# Patient Record
Sex: Female | Born: 1950 | Race: White | Hispanic: No | Marital: Married | Smoking: Never smoker
Health system: Southern US, Community
[De-identification: ages and names within clinical notes are randomized; demographics above are authoritative.]

## PROBLEM LIST (undated history)

## (undated) DIAGNOSIS — K219 Gastro-esophageal reflux disease without esophagitis: Secondary | ICD-10-CM

## (undated) DIAGNOSIS — D649 Anemia, unspecified: Secondary | ICD-10-CM

## (undated) DIAGNOSIS — R002 Palpitations: Secondary | ICD-10-CM

## (undated) DIAGNOSIS — R079 Chest pain, unspecified: Secondary | ICD-10-CM

## (undated) DIAGNOSIS — M810 Age-related osteoporosis without current pathological fracture: Secondary | ICD-10-CM

## (undated) DIAGNOSIS — I10 Essential (primary) hypertension: Secondary | ICD-10-CM

## (undated) DIAGNOSIS — I253 Aneurysm of heart: Secondary | ICD-10-CM

## (undated) DIAGNOSIS — IMO0002 Reserved for concepts with insufficient information to code with codable children: Secondary | ICD-10-CM

## (undated) DIAGNOSIS — R9430 Abnormal result of cardiovascular function study, unspecified: Secondary | ICD-10-CM

## (undated) HISTORY — DX: Reserved for concepts with insufficient information to code with codable children: IMO0002

## (undated) HISTORY — DX: Aneurysm of heart: I25.3

## (undated) HISTORY — DX: Palpitations: R00.2

## (undated) HISTORY — DX: Chest pain, unspecified: R07.9

## (undated) HISTORY — PX: TUBAL LIGATION: SHX77

## (undated) HISTORY — DX: Abnormal result of cardiovascular function study, unspecified: R94.30

## (undated) HISTORY — DX: Anemia, unspecified: D64.9

---

## 1997-12-08 ENCOUNTER — Ambulatory Visit (HOSPITAL_COMMUNITY): Admission: RE | Admit: 1997-12-08 | Discharge: 1997-12-08 | Payer: Self-pay | Admitting: Obstetrics & Gynecology

## 1998-02-11 ENCOUNTER — Other Ambulatory Visit: Admission: RE | Admit: 1998-02-11 | Discharge: 1998-02-11 | Payer: Self-pay | Admitting: Obstetrics & Gynecology

## 1998-03-12 ENCOUNTER — Ambulatory Visit (HOSPITAL_COMMUNITY): Admission: RE | Admit: 1998-03-12 | Discharge: 1998-03-12 | Payer: Self-pay | Admitting: Obstetrics & Gynecology

## 1998-05-09 ENCOUNTER — Ambulatory Visit (HOSPITAL_COMMUNITY): Admission: RE | Admit: 1998-05-09 | Discharge: 1998-05-09 | Payer: Self-pay | Admitting: Obstetrics & Gynecology

## 1999-02-18 ENCOUNTER — Other Ambulatory Visit: Admission: RE | Admit: 1999-02-18 | Discharge: 1999-02-18 | Payer: Self-pay | Admitting: Obstetrics & Gynecology

## 2000-08-25 ENCOUNTER — Other Ambulatory Visit: Admission: RE | Admit: 2000-08-25 | Discharge: 2000-08-25 | Payer: Self-pay | Admitting: Obstetrics & Gynecology

## 2001-02-09 ENCOUNTER — Encounter: Payer: Self-pay | Admitting: Obstetrics & Gynecology

## 2001-02-09 ENCOUNTER — Encounter: Admission: RE | Admit: 2001-02-09 | Discharge: 2001-02-09 | Payer: Self-pay | Admitting: Obstetrics & Gynecology

## 2001-11-04 ENCOUNTER — Other Ambulatory Visit: Admission: RE | Admit: 2001-11-04 | Discharge: 2001-11-04 | Payer: Self-pay | Admitting: Obstetrics & Gynecology

## 2002-12-21 ENCOUNTER — Other Ambulatory Visit: Admission: RE | Admit: 2002-12-21 | Discharge: 2002-12-21 | Payer: Self-pay | Admitting: Obstetrics & Gynecology

## 2004-01-14 ENCOUNTER — Other Ambulatory Visit: Admission: RE | Admit: 2004-01-14 | Discharge: 2004-01-14 | Payer: Self-pay | Admitting: Obstetrics & Gynecology

## 2006-01-05 HISTORY — PX: KNEE RECONSTRUCTION: SHX5883

## 2006-02-21 ENCOUNTER — Inpatient Hospital Stay (HOSPITAL_COMMUNITY): Admission: EM | Admit: 2006-02-21 | Discharge: 2006-02-22 | Payer: Self-pay | Admitting: Emergency Medicine

## 2006-02-21 ENCOUNTER — Ambulatory Visit: Payer: Self-pay | Admitting: Cardiology

## 2006-02-22 ENCOUNTER — Encounter: Payer: Self-pay | Admitting: Cardiology

## 2006-03-02 ENCOUNTER — Ambulatory Visit: Payer: Self-pay | Admitting: Cardiology

## 2006-03-02 LAB — CONVERTED CEMR LAB: TSH: 0.83 microintl units/mL (ref 0.35–5.50)

## 2006-03-09 ENCOUNTER — Emergency Department (HOSPITAL_COMMUNITY): Admission: EM | Admit: 2006-03-09 | Discharge: 2006-03-09 | Payer: Self-pay | Admitting: Emergency Medicine

## 2006-03-10 ENCOUNTER — Ambulatory Visit: Payer: Self-pay | Admitting: Orthopedic Surgery

## 2006-03-12 ENCOUNTER — Ambulatory Visit: Payer: Self-pay | Admitting: Orthopedic Surgery

## 2006-03-12 ENCOUNTER — Ambulatory Visit (HOSPITAL_COMMUNITY): Admission: RE | Admit: 2006-03-12 | Discharge: 2006-03-12 | Payer: Self-pay | Admitting: Orthopedic Surgery

## 2006-03-16 ENCOUNTER — Ambulatory Visit: Payer: Self-pay | Admitting: Orthopedic Surgery

## 2006-03-18 ENCOUNTER — Encounter (HOSPITAL_COMMUNITY): Admission: RE | Admit: 2006-03-18 | Discharge: 2006-04-17 | Payer: Self-pay | Admitting: Orthopedic Surgery

## 2006-03-22 ENCOUNTER — Ambulatory Visit: Payer: Self-pay | Admitting: Orthopedic Surgery

## 2006-04-21 ENCOUNTER — Ambulatory Visit: Payer: Self-pay | Admitting: Orthopedic Surgery

## 2006-05-25 ENCOUNTER — Ambulatory Visit: Payer: Self-pay | Admitting: Orthopedic Surgery

## 2006-07-08 ENCOUNTER — Ambulatory Visit: Payer: Self-pay | Admitting: Orthopedic Surgery

## 2006-10-19 ENCOUNTER — Ambulatory Visit: Payer: Self-pay | Admitting: Orthopedic Surgery

## 2006-10-26 ENCOUNTER — Encounter (INDEPENDENT_AMBULATORY_CARE_PROVIDER_SITE_OTHER): Payer: Self-pay | Admitting: Diagnostic Radiology

## 2006-10-26 ENCOUNTER — Encounter: Admission: RE | Admit: 2006-10-26 | Discharge: 2006-10-26 | Payer: Self-pay | Admitting: Obstetrics & Gynecology

## 2006-12-08 ENCOUNTER — Ambulatory Visit: Payer: Self-pay | Admitting: Orthopedic Surgery

## 2006-12-08 ENCOUNTER — Telehealth: Payer: Self-pay | Admitting: Orthopedic Surgery

## 2006-12-08 DIAGNOSIS — M2240 Chondromalacia patellae, unspecified knee: Secondary | ICD-10-CM | POA: Insufficient documentation

## 2006-12-08 DIAGNOSIS — M76899 Other specified enthesopathies of unspecified lower limb, excluding foot: Secondary | ICD-10-CM | POA: Insufficient documentation

## 2006-12-08 DIAGNOSIS — M25569 Pain in unspecified knee: Secondary | ICD-10-CM | POA: Insufficient documentation

## 2006-12-10 ENCOUNTER — Ambulatory Visit (HOSPITAL_COMMUNITY): Admission: RE | Admit: 2006-12-10 | Discharge: 2006-12-10 | Payer: Self-pay | Admitting: Orthopedic Surgery

## 2006-12-20 ENCOUNTER — Ambulatory Visit: Payer: Self-pay | Admitting: Orthopedic Surgery

## 2007-01-28 ENCOUNTER — Encounter: Payer: Self-pay | Admitting: Orthopedic Surgery

## 2007-02-21 ENCOUNTER — Ambulatory Visit: Payer: Self-pay | Admitting: Orthopedic Surgery

## 2007-02-21 DIAGNOSIS — S82009A Unspecified fracture of unspecified patella, initial encounter for closed fracture: Secondary | ICD-10-CM | POA: Insufficient documentation

## 2007-06-09 ENCOUNTER — Ambulatory Visit: Payer: Self-pay | Admitting: Cardiology

## 2007-06-09 LAB — CONVERTED CEMR LAB
AST: 16 units/L (ref 0–37)
Alkaline Phosphatase: 102 units/L (ref 39–117)
Bilirubin, Direct: 0.1 mg/dL (ref 0.0–0.3)
Direct LDL: 168.5 mg/dL
HDL: 44.2 mg/dL (ref >39.0)
Total Protein: 7.5 g/dL (ref 6.0–8.3)
Triglycerides: 147 mg/dL (ref 0–149)

## 2007-07-14 ENCOUNTER — Ambulatory Visit: Payer: Self-pay | Admitting: Cardiology

## 2007-07-14 ENCOUNTER — Ambulatory Visit (HOSPITAL_COMMUNITY): Admission: RE | Admit: 2007-07-14 | Discharge: 2007-07-14 | Payer: Self-pay | Admitting: Cardiology

## 2007-07-14 LAB — CONVERTED CEMR LAB
BUN: 11 mg/dL (ref 6–23)
Basophils Absolute: 0 10*3/uL (ref 0.0–0.1)
Basophils Relative: 0.7 % (ref 0.0–1.0)
Calcium: 9.5 mg/dL (ref 8.4–10.5)
Creatinine, Ser: 0.7 mg/dL (ref 0.4–1.2)
GFR calc Af Amer: 112 mL/min
GFR calc non Af Amer: 92 mL/min
Hemoglobin: 13.1 g/dL (ref 12.0–15.0)
Lymphocytes Relative: 36.3 % (ref 12.0–46.0)
MCHC: 34.2 g/dL (ref 30.0–36.0)
MCV: 92.3 fL (ref 78.0–100.0)
Neutrophils Relative %: 52.9 % (ref 43.0–77.0)
Prothrombin Time: 11.8 s (ref 10.9–13.3)
RBC: 4.16 M/uL (ref 3.87–5.11)
RDW: 12.2 % (ref 11.5–14.6)
WBC: 5.3 10*3/uL (ref 4.5–10.5)

## 2007-07-19 ENCOUNTER — Inpatient Hospital Stay (HOSPITAL_BASED_OUTPATIENT_CLINIC_OR_DEPARTMENT_OTHER): Admission: RE | Admit: 2007-07-19 | Discharge: 2007-07-19 | Payer: Self-pay | Admitting: Cardiovascular Disease

## 2007-07-19 ENCOUNTER — Ambulatory Visit: Payer: Self-pay | Admitting: Cardiovascular Disease

## 2007-07-30 ENCOUNTER — Emergency Department (HOSPITAL_COMMUNITY): Admission: EM | Admit: 2007-07-30 | Discharge: 2007-07-31 | Payer: Self-pay | Admitting: Emergency Medicine

## 2007-08-01 ENCOUNTER — Ambulatory Visit: Payer: Self-pay | Admitting: Internal Medicine

## 2007-08-01 DIAGNOSIS — E785 Hyperlipidemia, unspecified: Secondary | ICD-10-CM | POA: Insufficient documentation

## 2007-08-01 DIAGNOSIS — R1013 Epigastric pain: Secondary | ICD-10-CM | POA: Insufficient documentation

## 2007-08-01 DIAGNOSIS — M899 Disorder of bone, unspecified: Secondary | ICD-10-CM | POA: Insufficient documentation

## 2007-08-01 DIAGNOSIS — M949 Disorder of cartilage, unspecified: Secondary | ICD-10-CM

## 2007-08-01 DIAGNOSIS — K219 Gastro-esophageal reflux disease without esophagitis: Secondary | ICD-10-CM | POA: Insufficient documentation

## 2007-08-01 DIAGNOSIS — F411 Generalized anxiety disorder: Secondary | ICD-10-CM | POA: Insufficient documentation

## 2007-08-01 LAB — CONVERTED CEMR LAB
AST: 18 units/L (ref 0–37)
Albumin: 4.2 g/dL (ref 3.5–5.2)
Alkaline Phosphatase: 98 units/L (ref 39–117)
Bilirubin Urine: NEGATIVE
Bilirubin, Direct: 0.1 mg/dL (ref 0.0–0.3)
CO2: 29 meq/L (ref 19–32)
Cholesterol: 207 mg/dL (ref 0–200)
Creatinine, Ser: 0.6 mg/dL (ref 0.4–1.2)
Crystals: NEGATIVE
Direct LDL: 139.4 mg/dL
Folate: >20 ng/mL
H Pylori IgG: NEGATIVE
HDL: 41.2 mg/dL (ref >39.0)
Ketones, ur: NEGATIVE mg/dL
MCHC: 33.9 g/dL (ref 30.0–36.0)
MCV: 92.6 fL (ref 78.0–100.0)
Monocytes Absolute: 0.4 10*3/uL (ref 0.1–1.0)
Monocytes Relative: 6.4 % (ref 3.0–12.0)
Neutro Abs: 4 10*3/uL (ref 1.4–7.7)
Neutrophils Relative %: 58.8 % (ref 43.0–77.0)
Potassium: 4.4 meq/L (ref 3.5–5.1)
RBC: 4.11 M/uL (ref 3.87–5.11)
RDW: 12.3 % (ref 11.5–14.6)
Specific Gravity, Urine: >=1.03 (ref 1.000–1.03)
TSH: 0.98 microintl units/mL (ref 0.35–5.50)
Total Bilirubin: 0.8 mg/dL (ref 0.3–1.2)
Total Protein: 7.4 g/dL (ref 6.0–8.3)
Urine Glucose: NEGATIVE mg/dL
Vitamin B-12: 686 pg/mL (ref 211–911)
pH: 5 (ref 5.0–8.0)

## 2007-08-03 ENCOUNTER — Telehealth (INDEPENDENT_AMBULATORY_CARE_PROVIDER_SITE_OTHER): Payer: Self-pay | Admitting: *Deleted

## 2007-08-03 LAB — CONVERTED CEMR LAB: Vit D, 1,25-Dihydroxy: 23 — ABNORMAL LOW (ref 30–89)

## 2007-09-01 ENCOUNTER — Ambulatory Visit: Payer: Self-pay | Admitting: Cardiology

## 2007-09-05 ENCOUNTER — Ambulatory Visit (HOSPITAL_COMMUNITY): Admission: RE | Admit: 2007-09-05 | Discharge: 2007-09-06 | Payer: Self-pay | Admitting: Cardiology

## 2007-09-09 ENCOUNTER — Ambulatory Visit: Payer: Self-pay | Admitting: Cardiology

## 2007-11-11 ENCOUNTER — Encounter: Payer: Self-pay | Admitting: Orthopedic Surgery

## 2008-06-08 ENCOUNTER — Encounter: Payer: Self-pay | Admitting: Cardiology

## 2008-07-13 DIAGNOSIS — E559 Vitamin D deficiency, unspecified: Secondary | ICD-10-CM | POA: Insufficient documentation

## 2008-07-13 DIAGNOSIS — I10 Essential (primary) hypertension: Secondary | ICD-10-CM | POA: Insufficient documentation

## 2008-07-13 DIAGNOSIS — D510 Vitamin B12 deficiency anemia due to intrinsic factor deficiency: Secondary | ICD-10-CM | POA: Insufficient documentation

## 2009-05-13 ENCOUNTER — Encounter: Admission: RE | Admit: 2009-05-13 | Discharge: 2009-05-13 | Payer: Self-pay | Admitting: Otolaryngology

## 2010-05-20 NOTE — Assessment & Plan Note (Signed)
[facility]                            CARDIOLOGY OFFICE NOTE   NAME:[name], Julia CONAWAY                          MRN:          [id number]  DATE:06/09/2007                            DOB:          1950-05-14    PRIMARY CARE PHYSICIAN:  None.   REASON FOR VISIT:  Routine followup.   HISTORY OF PRESENT ILLNESS:  I saw Julia [name] in February of last year.  Her history is detailed in my previous note, including prior discussion  about a diagnostic cardiac catheterization, given intermittent episodes  of chest pain and a family history of cardiovascular disease and a  previously mildly abnormal Myoview demonstrating possible inferior scar  but no frank ischemia.  She was not sure about proceeding at that time  and wanted to consider the matter more and follow up with [place] in a few  weeks.  She did not come in for that followup visit, states that she  fell in March of last year and broke her right kneecap.  I have  actually not seen her back since that time.  She comes in today stating  that overall she has not had any progressive symptoms.  She has  occasional chest pain.  It is not clearly exertion provoked.  She also  has occasional palpitations and is only using her Lopressor very  intermittently, not on a regular basis.  She is also not taking an  aspirin daily.  She went for a health screening done through her local  church in [place], and was noted to have mild carotid  atherosclerosis bilaterally, and moderate risk for osteoporosis based  on bone mineral density testing.  We talked about this some today.  Her  electrocardiogram shows normal sinus rhythm at 84 beats per minute.  In  reviewing the situation again, Julia [name] does not wish to proceed with  any invasive cardiac testing.  She is comfortable with observation and  efforts at risk factor modification.  I spoke with her about specific  medication adjustments and followup lipid testing.  She was  comfortable  with this.   ALLERGIES:  SULFA DRUGS.   PRESENT MEDICATIONS:  1. Monthly B12 injections.  2. Calcium with vitamin D 600 mg p.o. b.i.d.  3. Multivitamin daily.  4. Lopressor 25 mg p.o. b.i.d. p.r.n.   REVIEW OF SYSTEMS:  As described in the history of present illness.  No  syncope, orthopnea, claudication, lower extremity edema.  Otherwise  negative.   PHYSICAL EXAMINATION:  Blood pressure is elevated today, 135/100,  recheck at 140/98.  She has had elevation in her blood pressure like  this dating back for several years intermittently.  Heart rate 84,  weight 163 pounds.  The patient is comfortable and in no acute distress.  HEENT:  Conjunctivae are normal, oropharynx clear.  NECK:  Supple no elevated jugular venous pressure, no carotid bruits.  No thyromegaly.  LUNGS:  Clear without labored breathing.  CARDIAC EXAM:  Reveals a regular rate and rhythm, no loud murmur or S3  gallop.  ABDOMEN:  Soft, nontender, no bruits.  EXTREMITIES:  Exhibit no pitting edema.  Distal pulses 2+.  SKIN:  Warm and dry.  MUSCULOSKELETAL:  No kyphosis is noted.  NEURO/PSYCHIATRIC:  Patient is alert and oriented x3.  Affect seems  appropriate.   IMPRESSION/RECOMMENDATIONS:  1. Prior history of mildly abnormal Myoview without frank ischemia,      but the possibility of inferior Koslosky scar.  We have discussed this      on a number of occasions, and Julia [name], at this point, is most      comfortable with observation and medical therapy without proceeding      with any additional invasive cardiac testing.  I have asked her to      be mindful for any progressive symptom changes, and otherwise she      should start taking an aspirin at 81 mg daily, and resume her      Lopressor at 25 mg p.o. b.i.d., both for efforts at more blood      pressure control and also management of her palpitations.  A lipid      profile and liver function tests will be obtained, as I suspect      statin therapy  may also be indicated, particularly with her mild      atherosclerosis noted on her carotid ultrasound.  She does not have      bruits on examination, and, at this point, I anticipate medical      therapy and observation.  I will plan to see her back over the next      6 months at our [place] office.  2. At this point Julia [name] does not have a primary care physician.  She      states that her husband sees Dr. Jonny Ruiz, and we will try to get her      scheduled to see him as well.     Jonelle Sidle, MD  Electronically Signed    SGM/MedQ  DD: 06/09/2007  DT: 06/09/2007  Job #: [id number]

## 2010-05-20 NOTE — Cardiovascular Report (Signed)
NAME:  Julia [name], Brunilda                      ACCOUNT NO.:  [id number]   MEDICAL RECORD NO.:  [id number]          PATIENT TYPE:  OIB   LOCATION:  1962                         FACILITY:  [facility]   PHYSICIAN:  Veverly Fells. Excell Seltzer, MD  DATE OF BIRTH:  26-Sep-1950   DATE OF PROCEDURE:  07/19/2007  DATE OF DISCHARGE:  07/19/2007                            CARDIAC CATHETERIZATION   PROCEDURE:  Left heart catheterization, selective coronary angiography,  left ventricular angiography.   INDICATION:  Ms. Quakenbush is a 60 year old woman with recurrent chest pain.  She has undergone a Myoview study that was suggestive of inferior scar  without ischemia.  In the setting of her current symptoms, she was  referred for cardiac catheterization.   Risks and indications of the procedure were reviewed with the patient.  Informed consent was obtained.  The right groin was prepped, draped, and  anesthetized with 1% lidocaine.  Using the modified Seldinger technique,  a 4-French sheath was placed in the right femoral artery.  Standard 4-  French Judkins catheters were used for coronary angiography and left  ventriculography.  A pullback across the aortic valve was performed.  All catheter exchanges were performed over a guidewire.  There were no  immediate complications.  The patient tolerated the procedure well.   FINDINGS:  Aortic pressure 132/78 with a mean of 104, left ventricular  pressure 132/15.   Coronary angiography, left mainstem.  There are minimal lumen  irregularities of the left main.  There are no significant stenoses  noted.  The left main bifurcates into the LAD and left circumflex.   LAD.  The LAD is a large-caliber vessel that courses down and reaches  the LV apex.  It supplies a small first diagonal branch and a moderate-  sized second diagonal.  The second diagonal branches into multiple  vessels.  The LAD has no significant stenosis throughout its course.   Left circumflex.  The left circumflex is a  moderate-sized vessel.  There  is a small intermediate branch noted.  The first OM branch is moderate-  sized, the second OM branch is moderate-sized.  There are no significant  stenoses throughout the left circumflex system.  The AV groove  circumflex beyond the second OM is tiny.   Right coronary artery.  The right coronary artery is dominant.  There  are minimal lumen irregularities in the proximal vessel.  There are no  significant stenoses throughout the vessel.  There is a small RV  marginal branch from the midportion.  The vessel bifurcates into a large  PDA branch and a smaller posterolateral branch.   Left ventriculography demonstrates normal LV systolic function.  The  LVEF is estimated between 55 and 60%.  There is no mitral regurgitation.   ASSESSMENT:  1. No significant coronary artery disease.  2. Normal left ventricular function.   PLAN:  Primary risk reduction measures.  The patient's chest pain is  likely noncardiac.      Veverly Fells. Excell Seltzer, MD  Electronically Signed     MDC/MEDQ  D:  07/19/2007  T:  07/19/2007  Job:  [id number]   cc:   Jonelle Sidle, MD

## 2010-05-20 NOTE — Assessment & Plan Note (Signed)
[facility]                       [place] CARDIOLOGY OFFICE NOTE   NAME:Julia [name], Julia [name]                          MRN:          [id number]  DATE:09/01/2007                            DOB:          [birth year]    I last saw Julia [name] back in June of this year in our [place] office.  She had a prior history of a mildly abnormal Myoview without frank  ischemia but the possibility of inferior Gladden scar associated with  fairly atypical recurrent chest pain and palpitations.  She ultimately  elected to proceed with a diagnostic cardiac catheterization after  having extensive discussions about this with me over a period of months.  This study was performed by Dr. Excell Seltzer on July 19, 2007, and revealed no  significant coronary artery disease with overall normal left ventricular  systolic function of 55-60%.  She comes to the office today for routine  followup.  She does not report any problems with chest pain.  Her main  concern is about intermittent palpitations.  This has also been assessed  in the past.  The chart incudes previous cardiac monitoring ordered by  Dr. Myrtis Ser back around the year 2000.  That particular report indicates  that with symptoms of palpitations, the patient was noted to have sinus  rhythm, some sinus tachycardia, and occasional supraventricular ectopy.  She has been managed with beta-blocker therapy, recently switched from  Lopressor to atenolol, and seems to be tolerating this better, although  she remains very concerned about these palpitations.  I have discussed  this with her again in fairly significant detail, trying to reassure  her, and I suspect that she is simply having benign palpitations,  perhaps occasional ectopy.  Recent findings of normal coronary arteries  and normal left ventricular systolic function would argue strongly  against any malignant arrhythmias.  She still would like to investigate  this further, and we talked  about wearing a 48-hour Holter monitor as  her symptoms are reported as being essentially daily.  She has had no  dizziness or syncope.  She does admit to being under stress.  She also  had some concerns about her low vitamin D level and I asked her to  discuss this with her primary care physician.  We talked about her  cholesterol management.  LDL in July was 139 and her HDL was 41.  During  a screening, she was noted to have mild carotid atherosclerosis and I  discussed with her the importance of aspirin and being aggressive in her  LDL control, at least under 100, preferably around 70.   ALLERGIES:  SULFA drugs.   PRESENT MEDICATIONS:  1. B12 injections monthly.  2. Calcium with vitamin D 600 mg p.o. b.i.d.  3. Multivitamin daily.  4. Vitamin D 400 international units daily.  5. Aspirin 81 mg p.o. daily.  6. Omega 3 supplements 1200 mg 2 tablets daily.  7. Atenolol 25 mg daily.  8. Red Yeast Rice 600 mg 2 tablets daily.   REVIEW OF SYSTEMS:  As described in the history of present illness.  Otherwise  negative.   PHYSICAL EXAMINATION:  Blood pressure is 122/80, heart rate is 66 and  regular, and weight is 165 pounds.  The patient is comfortable and in no acute distress.  HEENT:  Conjunctivae is normal.  Oropharynx is clear.  NECK:  Supple.  No elevated jugular venous pressure.  Soft right carotid  bruit.  Thyromegaly is not noted.  There is no thyroid tenderness.  LUNGS:  Clear with unlabored breathing.  CARDIAC:  Regular rate and rhythm.  No pathologic murmur or S3 gallop.  ABDOMEN:  Soft and nontender.  Normoactive bowel sounds.  EXTREMITIES:  No significant pitting edema.  Distal pulses are 2+.  SKIN:  Warm and dry.  MUSCULOSKELETAL:  No kyphosis noted.  NEUROPSYCHIATRIC:  The patient is alert and oriented x3.   IMPRESSION AND RECOMMENDATIONS:  1. Recent documentation of normal coronary arteries and normal      ejection fraction at cardiac catheterization in July.  I  discussed      this with the patient and tried to reassure her.  I would recommend      basic risk factor modification strategies at this point.  I also      talked about a regular exercise regimen with her.  2. Hyperlipidemia.  As discussed above, with findings of mild      atherosclerosis on screening the carotids done at a [facility]      back in March, I think aggressive LDL control would be in her best      interest.  She prefers to avoid statin medications at this point,      although I did not discuss it with her.  She states she will work      on her diet and exercise and we will plan a followup liver profile      and liver function tests in approximately 6 months.  At that time,      I will repeat a carotid duplex.  She will otherwise remain on an      aspirin.  3. Regarding the palpitations, I think that these are most likely      benign.  I have discussed this with her in detail.  She remains      very concerned about them and would like followup monitoring.  We      will arrange a 48-hour Holter monitor.  Otherwise, she will      continue on beta-blocker therapy at this point.     Jonelle Sidle, MD  Electronically Signed    SGM/MedQ  DD: 09/01/2007  DT: 09/02/2007  Job #: [id number]

## 2010-05-20 NOTE — Assessment & Plan Note (Signed)
[facility]                            CARDIOLOGY OFFICE NOTE   NAME:[name], Julia BRICKNER                          MRN:          [id number]  DATE:07/14/2007                            DOB:          28-Apr-1950    I saw Julia [name] in the office for a routine visit back on June 09, 2007.  Please see the dictated note from that visit for full details of the  patient's history and decision making process at that time.  We have  followed her after being seen as an inpatient consult last year with  symptoms of chest pain, left arm, and shoulder discomfort.  She  underwent a Myoview last year, which demonstrated a fixed inferior  defect with hypokinesis in this region and an ejection fraction of 48%,  but no frank ischemia was noted.  We have discussed these findings as  well as her symptoms now on at least two occasions and I have discussed  cardiac catheterization for definitive diagnosis of obstructive coronary  artery disease.  She has consistently preferred not to undergo any  invasive cardiac testing as it was discussed in my most recent note.  At  that time, she was comfortable with observation on medical therapy for  the possibility of underlying cardiovascular disease and was  symptomatically stable overall.   Since that time, Julia [name] has called back into the office and spoken  with nursing.  She stated that she had spoken to her husband about this  issue and ultimately decided that she would like to undergo an elective  outpatient cardiac catheterization.  This was scheduled by Tobi Bastos for  later next week and baseline laboratory data are presently pending.  Further plans to follow in this regard once coronary anatomy is defined.     Jonelle Sidle, MD  Electronically Signed    SGM/MedQ  DD: 07/14/2007  DT: 07/14/2007  Job #: [id number]

## 2010-05-23 NOTE — Assessment & Plan Note (Signed)
[facility]                            CARDIOLOGY OFFICE NOTE   NAME:Falco, AURELIE DICENZO                          MRN:          [id number]  DATE:03/02/2006                            DOB:          13-Nov-1950    REASON FOR VISIT:  Post hospitalization followup.   HISTORY OF PRESENT ILLNESS:  I saw Ms. Birchall as an inpatient consult  recently.  She was admitted to the Hospitalist Service with chest  discomfort, also reporting left arm and shoulder discomfort.  She ruled  out for myocardial infarction and her electrocardiogram was nonspecific.  She had an echocardiogram which revealed an ejection fraction of 50%  with mild focal basal septal hypertrophy but no Bourbeau motion  abnormalities.  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  She was referred for a Myocardial profusion study  which demonstrated a fixed inferior defect with hypokinesis and her  ejection fraction at 48%, but no active ischemia.  This was discussed  with the patient and she was ultimately discharged home with plan for  observation and followup.   She returns today stating that in general she feels better, although she  has had some episodes of chest pain.  She states that in retrospect she  thinks that her left shoulder discomfort was most likely musculoskeletal  given a prior history of tendinitis and given the fact that it has  improved following visits to her chiropractor recently.  She has had  some episodes of pressure in her chest, one of which she noted while  she was under stress in a meeting.  The others seemed more spontaneous  and somewhat different from her reflux.  Her electrocardiogram today was  normal showing sinus rhythm at 92 beats per minute.  She does tell me  that she has taken some of her husband's metoprolol at home and states  that this made her feel much better.  We talked about the situation  again today and she does feel that she would  like to proceed ultimately  with a diagnostic cardiac catheterization to clearly assess her coronary  anatomy.  She is also somewhat concerned about her thyroid status.  She  is not prepared to do any invasive testing now but will plan to see [place]  back in the office over the next few weeks to discuss more definitive  plans.   ALLERGIES:  SULFA DRUGS.   PRESENT MEDICATIONS:  1. Aspirin 81 mg p.o. daily.  2. Multivitamin 1 p.o. daily.  3. Calcium with vitamin D.  4. B-12 injections.   REVIEW OF SYSTEMS:  As described in the HPI.  She has had no dizziness  or syncope.   EXAMINATION:  Blood pressure is 140/90, heart rate 92, weight is 164  pounds, the patient is comfortable and in no acute distress.  HEENT:  Conjunctivae and lids normal, oropharynx is clear.  NECK:  Supple without elevated jugular venous pressure, without bruits.  No thyromegaly is noted.  LUNGS:  Clear without labored breathing at rest.  CARDIAC:  Reveals a regular  rate and rhythm without loud murmur or S3  gallops, there is no pericardial rub.  ABDOMEN:  Soft, nontender.  EXTREMITIES:  Show no pitting edemas.  SKIN:  Warm and dry.  Distal pulses are 2+.  MUSCULOSKELETAL:  No kyphosis is noted.   IMPRESSION/RECOMMENDATION:  1. Episodic chest pain as outlined.  Inpatient Myoview was overall low      risk but mildly abnormal.  We have discussed this and the plan at      this point is to continue aspirin with the addition of Lopressor 25      mg p.o. b.i.d.  I will also check a thyroid stimulating hormone      level.  Ms. Enck will return to the office over the next 3 weeks      and at that point we will likely make firm plans for a diagnostic      cardiac catheterization to clearly evaluate the coronary anatomy.      She will let us know otherwise if symptoms progress in the      meanwhile.  2. Further plans to follow.     Jonelle Sidle, MD  Electronically Signed    SGM/MedQ  DD: 03/02/2006  DT:  03/02/2006  Job #: [id number]

## 2010-05-23 NOTE — Op Note (Signed)
NAME:  Julia [name], Julia [name]                   ACCOUNT NO.:  [id number]   MEDICAL RECORD NO.:  [id number]          PATIENT TYPE:  AMB   LOCATION:  DAY                           FACILITY:  [facility]   PHYSICIAN:  Vickki Hearing, M.D.DATE OF BIRTH:  [birth year]   DATE OF PROCEDURE:  03/12/2006  DATE OF DISCHARGE:                               OPERATIVE REPORT   PREOPERATIVE DIAGNOSES:  Fractured right patella.   POSTOPERATIVE DIAGNOSES:  Fractured right patella.   PROCEDURE:  Removal of distal patellar fragment, advancement of patellar  tendon.   HISTORY:  This is a 60 year old female injured on 03/09/2006, fell onto  right knee at [facility], fractured her patella.  She was  scheduled for surgery after an informed consent process was completed.   SURGEON:  Vickki Hearing, M.D.   ASSISTANT:  Trenton Founds.   ANESTHESIA:  Spinal.   SPECIMENS:  None.   BLOOD LOSS:  50 mL   COMPLICATIONS:  None.   COUNTS:  Correct.   DISPOSITION:  The patient went to recovery room in stable condition.   DETAILS OF PROCEDURE:  The patient was identified as [name].  Limb  site was identified as right knee by patient and surgeon, marked as  such.  History and physical updated.  The patient taken to the operating  room, antibiotic started, spinal given.  The patient placed supine on  operating table, right knee prepped and draped in sterile technique.   Time-out procedure completed.  A 6-inch Esmarch was used to exsanguinate  the limb.  Tourniquet was elevated to 300 millimeter mercury.  Straight  incision was made centered over the patella, extended proximally and  distally down to the extensor mechanism.  Subcutaneous tissue was  cauterized to control bleeding.  Fracture was inspected, was debrided.  The knee joint was inspected and debrided.  Cartilage looked fairly  normal.   We placed a clamp around the distal fragment to attach it to the  proximal fragment but it was too  comminuted to repair so it was removed.  Patellar tendon was then advanced to the patella.   We used to #5 Tycron sutures weaved through the patellar tendon and then  passed through drill holes in the patella.  We tied them proximally.  We  checked range of motion.  She easily got 90 degrees of flexion without  any tension on the suture line.  We repaired the retinaculum with 0  Monocryl.  We injected the knee joint with 20 mL of Marcaine with  epinephrine.  We closed with 0 and 2-0 Monocryl and staples.  We applied  sterile dressing, cryo cuff and knee brace locked in extension.   The patient was taken to the recovery room in stable condition.   POSTOP PLAN:  She can weight bear with a walker or crutches and  extension with the brace locked.  We will advance her range of motion 0  to 45 in a couple of days.  She will follow-up with me in the office.  She will be discharged home on Lortab, ibuprofen  and Phenergan.      Vickki Hearing, M.D.  Electronically Signed     SEH/MEDQ  D:  03/12/2006  T:  03/12/2006  Job:  [id number]

## 2010-05-23 NOTE — Discharge Summary (Signed)
NAME:  Julia [name], Julia [name]                   ACCOUNT NO.:  [id number]   MEDICAL RECORD NO.:  [id number]          PATIENT TYPE:  INP   LOCATION:  3707                         FACILITY:  [facility]   PHYSICIAN:  Hillery Aldo, M.D.   DATE OF BIRTH:  09-01-1950   DATE OF ADMISSION:  02/21/2006  DATE OF DISCHARGE:  02/22/2006                               DISCHARGE SUMMARY   PRIMARY CARE PHYSICIAN:  The patient is unassigned.   DISCHARGE DIAGNOSES:  1. Noncardiac chest pain.  2. Gastroesophageal reflux disease.  3. History of tendinitis.  4. History of pernicious anemia.  5. Cholelithiasis.  6. Atrial septal aneurysm.   DISCHARGE MEDICATIONS:  Zantac OTC 1 tablet b.i.d. p.r.n.  Note, the  patient was also encouraged to try fish oil supplements for lipid  control.   CONSULTATIONS:  Dr. Diona Browner of cardiology.   BRIEF ADMISSION HISTORY OF PRESENT ILLNESS:  The patient is a 60-year-  old female who presented to the emergency department with 4- to 5-hour  history of chest pain radiating to the neck, left arm and back.  The  pain was intermittent and described as a dull ache.  For the full  details of the HPI, please see the dictated report done by Dr. Hadley Pen.   PROCEDURES AND DIAGNOSTIC STUDIES:  1. Chest x-ray on February 21, 2006 showed no active cardiopulmonary      disease.  2. Two-D echocardiogram done on February 22, 2006 showed systolic      function at the lower limits of normal with an ejection fraction of      50%.  There were no left ventricular regional Smalls motion      abnormalities.  There was mild focal basal septal hypertrophy.      There was an atrial septal aneurysm.  3. Myoview on February 22, 2006 showed a fixed defect with hypokinesis      and ejection fraction of 48% but no ischemia.  Recommendations were      for outpatient follow-up.   HOSPITAL COURSE:  1. Chest pain:  The patient was admitted to the telemetry floor and      monitored closely.  Serial enzymes were  negative x3 sets.  There      were no abnormalities noted on 12-lead EKG.  A cardiology      consultation was requested and kindly provided by Dr. Diona Browner who      performed his Myoview on February 22, 2006.  It was felt that in      the setting of negative enzymes and a normal EKG no further      diagnostic workup was indicated.  The patient did not have any      further chest pains during the remainder of her hospitalization.      She is therefore stable for discharge and will follow up with Dr.      Diona Browner p.r.n.  2. Gastroesophageal reflux disease:  The patient was given a GI      cocktail which seemed to alleviate her chest discomfort.      Additionally, she  was put on proton pump inhibitor therapy.  She      can continue to take Zantac over-the-counter p.r.n., and if this      does not adequately control her reflux, she should follow up with a      primary care physician for a prescription strength proton pump      inhibitor therapy.  3. Dyslipidemia:  The patient did have a fasting lipid panel checked      and was found to have a total cholesterol 202, triglycerides 195,      HDL of 45, and LDL of 118.  The patient was encouraged to try a low-      fat/cholesterol diet for 6 months and to supplement her diet with      fish oil capsules.  She is also encouraged to follow up with a      primary care doctor to follow up and monitor her dyslipidemia.   DISPOSITION:  The patient is stable for discharge home.  Again, she is  encouraged to find a primary care physician.      Hillery Aldo, M.D.  Electronically Signed     CR/MEDQ  D:  02/22/2006  T:  02/23/2006  Job:  [id number]

## 2010-05-23 NOTE — H&P (Signed)
NAME:  Julia [name], Julia [name]                   ACCOUNT NO.:  [id number]   MEDICAL RECORD NO.:  [id number]          PATIENT TYPE:  EMS   LOCATION:  MAJO                         FACILITY:  [facility]   PHYSICIAN:  Theresia Bough, MD       DATE OF BIRTH:  Jan 29, 1950   DATE OF ADMISSION:  02/21/2006  DATE OF DISCHARGE:                              HISTORY & PHYSICAL   PRESENTING COMPLAINT:  Chest pain.   HISTORY OF PRESENT ILLNESS:  This is a 60 year old white female patient  who came in because of left-sided chest pain.  Pain started about 5 days  ago and has been intermittent.  Patient's chest pain was relieved at the  ED by 0.4 mg of sublingual nitroglycerin.  She denies diaphoresis.  No  nausea.  No vomiting.  No diarrhea.  No radiation of chest pain.  She  denies any abdominal pain.  She denies any headaches.  No dizziness and  no blurring of vision.  She has no feet swelling.  She denies any fever.  No cough.  No wheezing and no dysuria.  She denied any significant past  medical history.  She has been in good health.   FAMILY HISTORY:  Her father died of massive MI at the age of 51.  Her  mother also has a history of MI around the age of 40.   SOCIAL HISTORY:  She lives with her husband.  She does not smoke  cigarettes.  She does not use alcohol.  Denies no history of drug abuse.   DRUG ALLERGIES:  TO SULFA.   HOME MEDICATIONS:  Include Zantac.   PHYSICAL EXAMINATION:  VITAL SIGNS:  Shows temperature of 98.3.  Pulse  rate of 86.  Respirations of 16.  Blood pressure of 137/89.  HEAD AND NECK:  Shows pink conjunctivae.  She has no jaundice.  Neck is  supple.  Mucous membranes is moist.  CHEST:  Showed normal respiration.  Auscultation shows normal breath  sounds.  CARDIOVASCULAR:  Showed normal heart sounds.  No murmur.  No gallop.  ABDOMEN:  Soft, nontender.  No masses palpable.  Patient has normal  bowel sounds.  EXTREMITIES:  Shows no edema.  SKIN:  Warm to touch.  CENTRAL NERVOUS SYSTEM:   Patient is alert and oriented x3.  Power is 4/4  in all limbs.  There is no focal deficits.   Initial blood work shows a sodium of 141, potassium of 4.2, chloride of  108, bicarb of 27, BUN of 11, creatinine 0.7, glucose of 99.  EKG is  normal sinus rhythm.  No acute ST/T changes.  Troponin is 0.0.  CK-MB is  less than 1.0.  A chest x-ray is negative.   ASSESSMENT:  New onset angina.   My plan is to admit patient to telemetry bed.  I will continue serial CK-  MB and troponin.  I will also do a fasting lipid profile.  Patient is  going to have metoprolol 25 mg p.o. q.12 hours, hold for blood pressure  less than or equal to 90/60.  I have  called cardiology consult for  stress test.      Theresia Bough, MD  Electronically Signed     GA/MEDQ  D:  02/21/2006  T:  02/22/2006  Job:  [id number]

## 2010-05-23 NOTE — Consult Note (Signed)
Julia [name], Julia [name]                   ACCOUNT NO.:  [id number]   MEDICAL RECORD NO.:  [id number]          PATIENT TYPE:  INP   LOCATION:  3707                         FACILITY:  [facility]   PHYSICIAN:  Jonelle Sidle, MD DATE OF BIRTH:  January 05, 1951   DATE OF CONSULTATION:  02/21/2006  DATE OF DISCHARGE:  02/22/2006                                 CONSULTATION   REFERRING PHYSICIAN:  [facility].   PRIMARY CARDIOLOGIST:  Luis Abed, MD, [facility].   PRIMARY CARE PHYSICIAN:  Laverle Hobby, M.D.   HISTORY OF PRESENT ILLNESS:  This is a very pleasant 60 year old  Caucasian female with no prior cardiac history with complaints of a 4-5  day history of chest discomfort radiating to the neck, left arm and  back.  The patient states the pain goes and comes at rest, described as  a dull ache with no associated diaphoresis, dizziness or shortness of  breath.  The patient has a history of GERD and she felt like this was  probably related to that, so she took some Zantac over the last three  days with no relief.  She also has a history of left arm tendinitis  which she took some pain medication for which did not relieve the chest  discomfort.  Secondary to these continuing symptoms the patient  presented to the emergency room on 02/21/2006 and was admitted to rule  out cardiac etiology.   PAST MEDICAL HISTORY:  Includes GERD, tendinitis of the left arm,  pernicious anemia and history of gallstones.   PAST SURGICAL HISTORY:  Bilateral tubal ligation and cosmetic surgery.   SOCIAL HISTORY:  The patient lives in [place] with her husband.  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  She has no  children.  She does not smoke.  She occasionally drinks alcohol.  No  drug use.  No herbal medicine use.  She is not on a special diet and she  does not exercise regularly.   FAMILY HISTORY:  Mother died at age 44 with a myocardial infarction.  Father died at age 26 with a  myocardial infarction.  She has two  brothers who are generally in good health with no cardiac history.   CURRENT MEDICATIONS:  At home calcium, vitamin D supplements,  multivitamins.  She takes a B12 shot once a month.  While hospitalized:  1. Aspirin 81 mg once a day.  2. Lovenox once a day,  3. Lopressor 25 mg b.i.d..  4. Protonix 40 mg once a day.   ALLERGIES:  SULFA.   PAST CARDIAC WORK UP:  The patient states she had a Cardiolite stress  test approximately 10 years ago with Dr. Myrtis Ser secondary to palpitations.  It was found to be negative and palpitations were found to be related to  birth control pill use.  When she stopped taking birth control pills  these palpitations went away.   CURRENT LABS:  Troponins are negative times two at 0.02 and 0.02.  Sodium 141, potassium 4.2, chloride 108, CO2 24, BUN 11, creatinine 0.7,  glucose 99,  cholesterol 202 and lipids 195, HDL 45, LDL 118.   PHYSICAL EXAMINATION:  VITAL SIGNS:  Blood pressure 126/84, pulse 80,  respirations 20, temperature 97.3.  The patient weighs 73.5 kg.  She is  on room air at 100% 02 sat.  HEENT:  Head is normocephalic and atraumatic.  Eyes:  PERRLA.  Mucous  membranes mouth pink and moist.  Tongue is midline.  NECK:  Supple.  There is no thyromegaly noted.  No carotid bruits  appreciated.  CARDIOVASCULAR:  Regular rate and rhythm.  Slightly tachycardiac without  murmurs, rubs or gallops.  LUNGS:  Clear to auscultation.  ABDOMEN:  Soft and nontender.  No Murphy's sign is elicited with  palpitation.  There  are 2+ bowel sounds.  EXTREMITIES:  Without clubbing, cyanosis or edema.  Dorsalis pedis  pulses and radial pulses are 1+ bilaterally.  SKIN:  Warm and dry.  NEURO:  Intact.   IMPRESSION:  1. Chest pain, rule out cardiac etiology.  2. Known history of pernicious anemia.  3. History of gastroesophageal reflux disease.   PLAN:  The patient has been seen and examined by myself and Dr. Nona Dell.   The patient has a family history of CAD and mildly elevated  LDL with no prior documented coronary artery disease.  Her reported  tendinitis of the left shoulder and arm as well as reflux along with  some left-sided chest pain radiating in to the arm and neck is noted.  Fairly prolonged and mild to moderate pain.  She did have a stress test  in the past with Dr. Myrtis Ser.  Troponin and EKG are normal.  We discussed  with her options of cardiac catheterization versus Myoview stress test  for further cardiac evaluation.  She has elected to proceed with Myoview  stress test at this point.  If the stress test is low risk, the patient  will be seen back as an outpatient with Dr. Myrtis Ser for further evaluation  and treatment and discussion of necessity to place her on a statin.  At  this time we will continue her current medication regimen and will  schedule a Myoview stress test today with results to be discussed later.      Bettey Mare. Lyman Bishop, NP      Jonelle Sidle, MD  Electronically Signed    KML/MEDQ  D:  02/22/2006  T:  02/23/2006  Job:  [id number]

## 2011-09-29 ENCOUNTER — Encounter (HOSPITAL_COMMUNITY): Payer: Self-pay | Admitting: *Deleted

## 2011-09-29 DIAGNOSIS — I10 Essential (primary) hypertension: Secondary | ICD-10-CM | POA: Insufficient documentation

## 2011-09-29 DIAGNOSIS — K219 Gastro-esophageal reflux disease without esophagitis: Secondary | ICD-10-CM | POA: Insufficient documentation

## 2011-09-29 DIAGNOSIS — R079 Chest pain, unspecified: Principal | ICD-10-CM | POA: Insufficient documentation

## 2011-09-29 NOTE — ED Notes (Signed)
The pt is c/o lt upper chest pain with nausea since she woke up on the couch approx 1-2 hours ago.  She took 2 81 mg aspirin  Before she left home and the pain is better now.  History of reflux

## 2011-09-30 ENCOUNTER — Emergency Department (HOSPITAL_COMMUNITY)
Admit: 2011-09-30 | Discharge: 2011-09-30 | Disposition: A | Discharge status: Home / Self Care | Payer: BC Managed Care – PPO | Source: Ambulatory Visit | Attending: Emergency Medicine | Admitting: Emergency Medicine

## 2011-09-30 ENCOUNTER — Observation Stay (HOSPITAL_COMMUNITY)
Admission: EM | Admit: 2011-09-30 | Discharge: 2011-09-30 | Disposition: A | Discharge status: Home / Self Care | Payer: BC Managed Care – PPO | Attending: Emergency Medicine | Admitting: Emergency Medicine

## 2011-09-30 DIAGNOSIS — R079 Chest pain, unspecified: Secondary | ICD-10-CM

## 2011-09-30 HISTORY — DX: Essential (primary) hypertension: I10

## 2011-09-30 HISTORY — DX: Gastro-esophageal reflux disease without esophagitis: K21.9

## 2011-09-30 LAB — POCT I-STAT TROPONIN I: Troponin i, poc: 0 ng/mL (ref 0.00–0.08)

## 2011-09-30 LAB — CBC WITH DIFFERENTIAL/PLATELET
Eosinophils Relative: 2 % (ref 0–5)
HCT: 37.6 % (ref 36.0–46.0)
Lymphocytes Relative: 46 % (ref 12–46)
MCH: 31.5 pg (ref 26.0–34.0)
MCHC: 34 g/dL (ref 30.0–36.0)
MCV: 92.6 fL (ref 78.0–100.0)
Neutro Abs: 3.8 10*3/uL (ref 1.7–7.7)
Neutrophils Relative %: 44 % (ref 43–77)
Platelets: 267 10*3/uL (ref 150–400)
RBC: 4.06 MIL/uL (ref 3.87–5.11)
RDW: 12.5 % (ref 11.5–15.5)

## 2011-09-30 LAB — COMPREHENSIVE METABOLIC PANEL
Albumin: 3.7 g/dL (ref 3.5–5.2)
Chloride: 105 mEq/L (ref 96–112)
Creatinine, Ser: 0.69 mg/dL (ref 0.50–1.10)
GFR calc non Af Amer: >90 mL/min (ref >90)
Glucose, Bld: 101 mg/dL — ABNORMAL HIGH (ref 70–99)
Sodium: 141 mEq/L (ref 135–145)
Total Bilirubin: 0.3 mg/dL (ref 0.3–1.2)

## 2011-09-30 MED ORDER — METOPROLOL TARTRATE 25 MG PO TABS
100.0000 mg | ORAL_TABLET | Freq: Once | ORAL | Status: AC
  Administered 2011-09-30: 100 mg via ORAL
  Filled 2011-09-30: qty 4
  Filled 2011-09-30: qty 1
  Filled 2011-09-30: qty 3

## 2011-09-30 MED ORDER — METOPROLOL TARTRATE 25 MG PO TABS
50.0000 mg | ORAL_TABLET | Freq: Once | ORAL | Status: AC
  Administered 2011-09-30: 50 mg via ORAL
  Filled 2011-09-30: qty 2

## 2011-09-30 NOTE — ED Notes (Signed)
Oriented pt back to room from the bathroom

## 2011-09-30 NOTE — ED Notes (Signed)
EKG completed and given to EDP along with previous EKG

## 2011-09-30 NOTE — ED Notes (Signed)
Spoke with Britta Mccreedy in [place] - advised MD was not available for CT at this time.   Heartrate still over 60, Britta Mccreedy will callback to advised.

## 2011-09-30 NOTE — ED Provider Notes (Signed)
10:37 AM BP 137/82  Pulse 78  Temp 98.5 F (36.9 C) (Oral)  Resp 15  Ht 5\' 9"  (1.753 m)  Wt 164 lb 14.4 oz (74.798 kg)  BMI 24.35 kg/m2  SpO2 99%   Filed Vitals:   09/30/11 0700 09/30/11 0800 09/30/11 0830 09/30/11 0900  BP: 125/69 142/87 137/89 137/82  Pulse: 78 83 74 78  Temp:      TempSrc:      Resp: 13 14 14 15   Height: 5\' 9"  (1.753 m)     Weight: 164 lb 14.4 oz (74.798 kg)     SpO2: 98% 99% 97% 99%  Patient here a chest pain protocol. Patient has received 150 mg of metoprolol and her heart rate, has not dropped below 70. At this point. She is no longer a candidate for CT angiogram as we cannot decrease her heart rate, low enough. Patient states that she is not having any chest pain. Currently. She does still have some back pain, which she states is on the spinous process at about T5. She is tender to palpation of the spinous process. She's also tender to palpation of the anterior chest Nielson on the left side. States is similar to her previous chest pain that she came in to be evaluated for. She has had negative EKG. Repeat troponin done this morning at 7:56 AM was also negative.  CV: RRR, No M/R/G, Peripheral pulses intact. No peripheral edema. Lungs: CTAB Abd: Soft, Non tender, non distended     I discussed the patient with Dr. Azalia Bilis, who is the attending physician for this patient. At this point. I believe she is safe for discharge. I will have her followup with cardiology outpatient. Dr. Patria Mane agrees with plan of care. Patient expresses understanding and agrees with plan. Discussed return precautions  Arthor Captain, PA-C 09/30/11 1043

## 2011-09-30 NOTE — ED Provider Notes (Signed)
History     CSN: [id number]  Arrival date & time 09/29/11  2346   First MD Initiated Contact with Patient 09/30/11 0110      Chief Complaint  Patient presents with  . Chest Pain    (Consider location/radiation/quality/duration/timing/severity/associated sxs/prior treatment) Patient is a 61 y.o. female presenting with chest pain. The history is provided by the patient.  Chest Pain The chest pain began 3 - 5 hours ago. Duration of episode(s) is 2 minutes. Chest pain occurs intermittently. The chest pain is resolved. At its most intense, the pain is at 7/10. The pain is currently at 0/10. The severity of the pain is moderate. The quality of the pain is described as heavy. The pain does not radiate. She tried aspirin for the symptoms.  Her past medical history is significant for hypertension.  Her family medical history is significant for CAD in family.  Procedure history is positive for cardiac catheterization. Procedure history comments: 2009.     Past Medical History  Diagnosis Date  . GERD (gastroesophageal reflux disease)   . Hypertension     History reviewed. No pertinent past surgical history.  No family history on file.  History  Substance Use Topics  . Smoking status: Never Smoker   . Smokeless tobacco: Not on file  . Alcohol Use: No    OB History    Grav Para Term Preterm Abortions TAB SAB Ect Mult Living                  Review of Systems  Cardiovascular: Positive for chest pain.  All other systems reviewed and are negative.    Allergies  Sulfur  Home Medications  No current outpatient prescriptions on file.  BP 148/96  Pulse 86  Temp 98.5 F (36.9 C) (Oral)  Resp 20  SpO2 97%  Physical Exam  Constitutional: She is oriented to person, place, and time. She appears well-developed and well-nourished.  HENT:  Head: Normocephalic and atraumatic.  Eyes: Conjunctivae normal and EOM are normal. Pupils are equal, round, and reactive to light.    Neck: Normal range of motion.  Cardiovascular: Normal rate, regular rhythm and normal heart sounds.   Pulmonary/Chest: Effort normal and breath sounds normal.  Abdominal: Soft. Bowel sounds are normal.  Musculoskeletal: Normal range of motion.  Neurological: She is alert and oriented to person, place, and time.  Skin: Skin is warm and dry.  Psychiatric: She has a normal mood and affect. Her behavior is normal.    ED Course  Procedures (including critical care time)  Labs Reviewed  COMPREHENSIVE METABOLIC PANEL - Abnormal; Notable for the following:    Glucose, Bld 101 (*)     All other components within normal limits  CBC WITH DIFFERENTIAL  TROPONIN I   Dg Chest 2 View  09/30/2011  *RADIOLOGY REPORT*  Clinical Data: Left-sided chest pain.  CHEST - 2 VIEW  Comparison: 07/14/2007.  Findings:  Cardiopericardial silhouette within normal limits. Mediastinal contours normal. Trachea midline.  No airspace disease or effusion.  IMPRESSION: No active cardiopulmonary disease.   Original Report Authenticated By: Andreas Newport, M.D.      No diagnosis found.   Date: 09/30/2011  Rate: 86  Rhythm: normal sinus rhythm  QRS Axis: normal  Intervals: normal  ST/T Wave abnormalities: normal  Conduction Disutrbances: none  Narrative Interpretation: unremarkable     MDM  + atypical chest pain.  Enzymes neg.  Will place in cdu on chest pain protocol,  reassess  Zoe Goonan Lytle Michaels, MD 09/30/11 0140

## 2011-09-30 NOTE — ED Notes (Signed)
BMI 24.4 

## 2011-09-30 NOTE — ED Notes (Signed)
I Stat Troponin results  cTnl  0.00 ng/mL

## 2011-09-30 NOTE — ED Notes (Signed)
Spoke with [employer], MD and he advised need to give an additional 50 mg of Metoprolol and wait an hour to see if HR will come down to under 70.   Advised PA.

## 2011-09-30 NOTE — ED Notes (Signed)
TROPONIN RESULTS  cTnl  0.00 ng/mL

## 2011-09-30 NOTE — ED Notes (Signed)
Order move pt to CDU for further study pt remains pain free at this time.

## 2011-09-30 NOTE — ED Notes (Signed)
Results of troponin per POCT protocols:    0.00

## 2011-09-30 NOTE — Progress Notes (Signed)
Utilization review completed.  

## 2011-09-30 NOTE — ED Notes (Signed)
Ambulated pt to the restroom  

## 2011-09-30 NOTE — ED Provider Notes (Signed)
Medical screening examination/treatment/procedure(s) were performed by non-physician practitioner and as supervising physician I was immediately available for consultation/collaboration.   Lyanne Co, MD 09/30/11 [phone]

## 2011-09-30 NOTE — ED Notes (Signed)
Patient resting quietly, calm with unlabored respirations.  

## 2011-10-01 ENCOUNTER — Encounter: Payer: Self-pay | Admitting: Cardiology

## 2011-10-01 DIAGNOSIS — K219 Gastro-esophageal reflux disease without esophagitis: Secondary | ICD-10-CM | POA: Insufficient documentation

## 2011-10-01 DIAGNOSIS — I253 Aneurysm of heart: Secondary | ICD-10-CM | POA: Insufficient documentation

## 2011-10-01 DIAGNOSIS — R079 Chest pain, unspecified: Secondary | ICD-10-CM | POA: Insufficient documentation

## 2011-10-01 DIAGNOSIS — R9430 Abnormal result of cardiovascular function study, unspecified: Secondary | ICD-10-CM | POA: Insufficient documentation

## 2011-10-02 ENCOUNTER — Ambulatory Visit (INDEPENDENT_AMBULATORY_CARE_PROVIDER_SITE_OTHER): Payer: BC Managed Care – PPO | Admitting: Cardiology

## 2011-10-02 ENCOUNTER — Encounter: Payer: Self-pay | Admitting: Cardiology

## 2011-10-02 VITALS — BP 154/90 | HR 88 | Ht 69.0 in | Wt 165.4 lb

## 2011-10-02 DIAGNOSIS — R079 Chest pain, unspecified: Secondary | ICD-10-CM

## 2011-10-02 DIAGNOSIS — I10 Essential (primary) hypertension: Secondary | ICD-10-CM

## 2011-10-02 DIAGNOSIS — R002 Palpitations: Secondary | ICD-10-CM

## 2011-10-02 DIAGNOSIS — K219 Gastro-esophageal reflux disease without esophagitis: Secondary | ICD-10-CM

## 2011-10-02 NOTE — Assessment & Plan Note (Signed)
Blood pressure is slightly elevated. She appears to have white coat hypertension. Her blood pressure when checked elsewhere is always normal. No further workup.

## 2011-10-02 NOTE — Progress Notes (Signed)
HPI   Patient is seen for cardiology followup. She is seen after a emergency room visit. The patient had been seen in the past by our group. She's not been seen for many years. 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 As part of today's evaluation I have reviewed all the old records and completely updated the electronic medical record. Patient has a family history of coronary disease. She had a borderline abnormal nuclear stress test in 2009 and eventually cardiac catheterization was done April, 2009. There was no significant coronary disease. Also historically she's had some palpitations. She actually wore a monitor in 2000. She has sinus rhythm with sinus tachycardia. There was some supraventricular ectopy. She was treated with a beta blocker and felt better.  Recently on one evening she had been to the funeral home after a 36 year old friend had died. She was at home and had eaten some and lay  flat on the couch. She does have GERD. She awoke feeling some chest discomfort. Eventually she was seen in the emergency room. There was no EKG change. Enzymes were normal. She was allowed to go home and she is now here for followup.  Her pain resolved spontaneously in the emergency room. She has not had any problems since. She's not having any significant palpitations.  Allergies  Allergen Reactions  . Sulfur Hives    Current Outpatient Prescriptions  Medication Sig Dispense Refill  . aspirin EC 81 MG tablet Take 81 mg by mouth daily.      Marland Kitchen atenolol (TENORMIN) 25 MG tablet Take 12.5 mg by mouth 2 (two) times daily.      . cetirizine (ZYRTEC) 10 MG tablet Take 10 mg by mouth daily.      . Cholecalciferol (VITAMIN D-3) 5000 UNITS TABS Take 1 tablet by mouth daily.      . Coenzyme Q10 (CO Q 10 PO) Take 1 tablet by mouth daily.      . CYANOCOBALAMIN IJ Inject 100 mg as directed once a week. Usually on Wednesday      . estradiol (ESTRACE) 0.5 MG tablet Take 0.5 mg by mouth daily.      .  medroxyPROGESTERone (PROVERA) 2.5 MG tablet Take 2.5 mg by mouth daily.      . Multiple Vitamin (MULTIVITAMIN WITH MINERALS) TABS Take 1 tablet by mouth daily.      . Omega-3 Fatty Acids (FISH OIL PO) Take 2 capsules by mouth 2 (two) times daily.      Marland Kitchen omeprazole (PRILOSEC) 20 MG capsule Take 20 mg by mouth daily.      . Red Yeast Rice Extract (RED YEAST RICE PO) Take 1 tablet by mouth 2 (two) times daily.      Marland Kitchen thyroid (ARMOUR) 30 MG tablet Take 30 mg by mouth daily.      Marland Kitchen trimethoprim (TRIMPEX) 100 MG tablet Take 100 mg by mouth 2 (two) times daily.         History   Social History  . Marital Status: Married    Spouse Name: N/A    Number of Children: N/A  . Years of Education: N/A   Occupational History  . Not on file.   Social History Main Topics  . Smoking status: Never Smoker   . Smokeless tobacco: Not on file  . Alcohol Use: No  . Drug Use:   . Sexually Active:    Other Topics Concern  . Not on file   Social History Narrative  . No narrative  on file    No family history on file.  Past Medical History  Diagnosis Date  . GERD (gastroesophageal reflux disease)   . Hypertension   . Chest pain     Myoview in the past, no definite ischemia,, possible inferior scar   //   cardiac catheterization July 19, 2007,  no significant coronary disease,  EF 55-60%  . Ejection fraction     EF 50-55%, echo, February, 2008,  no Antenucci motion abnormalities     EF 55-60%, catheterization, 2009  . Palpitations     Monitor in 2000 showed sinus rhythm and sinus tachycardia. There were occasional supraventricular beats. She was treated with beta blocker  . Atrial septal aneurysm     Incidental finding, echo, 2008    No past surgical history on file.  ROS   Patient denies fever, chills, headache, sweats, rash, change in vision, change in hearing, chest pain, cough, nausea vomiting, urinary symptoms. All other systems are reviewed and are negative.  PHYSICAL EXAM   Patient is  oriented to person time and place. Affect is normal. There is no jugular venous distention. Lungs are clear. Respiratory effort is nonlabored. Cardiac exam reveals S1 and S2. There no clicks or significant murmurs. Abdomen is soft. There is no peripheral edema. There no musculoskeletal deformities. There are no skin rashes.  Filed Vitals:   10/02/11 1051  BP: 154/90  Pulse: 88  Height: 5\' 9"  (1.753 m)  Weight: 165 lb 6.4 oz (75.025 kg)  SpO2: 98%   I reviewed the EKG done at the hospital recently. There was no significant abnormality.  ASSESSMENT & PLAN

## 2011-10-02 NOTE — Assessment & Plan Note (Signed)
At this point I doubt that her recent episode of limited chest pain was cardiac in origin. She needs no further workup. Cardiac catheterization in 2009 revealed no significant coronary disease. I decided not to proceed with exercise testing. I will see her back over time to see if there is any change in the pattern of any chest pain.

## 2011-10-02 NOTE — Assessment & Plan Note (Signed)
The patient is on GERD. She's taking a medication for this for 2 weeks. I discussed with her again the importance of not lying down flat after eating.

## 2011-10-02 NOTE — Patient Instructions (Signed)
Continue all current medications. Your physician wants you to follow up in: 6 months.  You will receive a reminder letter in the mail one-two months in advance.  If you don't receive a letter, please call our office to schedule the follow up appointment   

## 2011-10-02 NOTE — Assessment & Plan Note (Signed)
She is not having any significant palpitations. She continues low-dose beta blocker.

## 2012-02-20 ENCOUNTER — Other Ambulatory Visit: Payer: Self-pay

## 2012-03-14 ENCOUNTER — Other Ambulatory Visit: Payer: Self-pay | Admitting: Obstetrics & Gynecology

## 2012-03-23 ENCOUNTER — Telehealth: Payer: Self-pay | Admitting: Vascular Surgery

## 2012-03-23 NOTE — Telephone Encounter (Signed)
This pt needs an appt as a new pt with anyone next available I have just received a call on the PAD helpline.  Patients Info  Julia [name]  [birth year]  [phone]  Patient lives in [place]   Thanks in advance for your help!  Rosaria Ferries Secretary [facility] [phone]   ____________________________________  03/23/12: [name] Monday with patient to schedule appointment- notified JJK here @ [facility] and marked in PAD book, dpm

## 2012-04-13 ENCOUNTER — Other Ambulatory Visit: Payer: Self-pay | Admitting: *Deleted

## 2012-04-13 DIAGNOSIS — R209 Unspecified disturbances of skin sensation: Secondary | ICD-10-CM

## 2012-04-20 ENCOUNTER — Encounter: Payer: BC Managed Care – PPO | Admitting: Vascular Surgery

## 2012-05-09 ENCOUNTER — Encounter: Payer: BC Managed Care – PPO | Admitting: Surgery

## 2012-05-24 ENCOUNTER — Encounter: Payer: Self-pay | Admitting: Vascular Surgery

## 2012-05-25 ENCOUNTER — Ambulatory Visit (INDEPENDENT_AMBULATORY_CARE_PROVIDER_SITE_OTHER): Payer: BC Managed Care – PPO | Admitting: Vascular Surgery

## 2012-05-25 ENCOUNTER — Encounter: Payer: Self-pay | Admitting: Vascular Surgery

## 2012-05-25 ENCOUNTER — Encounter (INDEPENDENT_AMBULATORY_CARE_PROVIDER_SITE_OTHER): Payer: BC Managed Care – PPO

## 2012-05-25 VITALS — BP 134/75 | HR 81 | Ht 69.0 in | Wt 169.5 lb

## 2012-05-25 DIAGNOSIS — R209 Unspecified disturbances of skin sensation: Secondary | ICD-10-CM

## 2012-05-25 DIAGNOSIS — I739 Peripheral vascular disease, unspecified: Secondary | ICD-10-CM | POA: Insufficient documentation

## 2012-05-25 NOTE — Progress Notes (Signed)
[facility]  Patient name: Julia [name] MRN: [id number] DOB: 1950/03/23 Sex: female  REASON FOR CONSULT: evaluate for peripheral vascular disease.  HPI: Julia [name] is a 62 y.o. female who read a Brochure from [facility] and felt that she should be evaluated for peripheral vascular disease. I do not get any history of claudication, rest pain, or nonhealing ulcers.  Her risk factors for peripheral vascular disease include diabetes and hypertension. In addition she has a history of premature cardiovascular disease. Denies any history of tobacco use or diabetes.  Past Medical History  Diagnosis Date  . GERD (gastroesophageal reflux disease)   . Hypertension   . Chest pain     Myoview in the past, no definite ischemia,, possible inferior scar   //   cardiac catheterization July 19, 2007,  no significant coronary disease,  EF 55-60%  . Ejection fraction     EF 50-55%, echo, February, 2008,  no Krull motion abnormalities     EF 55-60%, catheterization, 2009  . Palpitations     Monitor in 2000 showed sinus rhythm and sinus tachycardia. There were occasional supraventricular beats. She was treated with beta blocker  . Atrial septal aneurysm     Incidental finding, echo, 2008  . Anemia    FAMILY HISTORY: Her father had a heart attack at age 61.  SOCIAL HISTORY: History  Substance Use Topics  . Smoking status: Never Smoker   . Smokeless tobacco: Not on file  . Alcohol Use: No    Allergies  Allergen Reactions  . Sulfur Hives    Current Outpatient Prescriptions  Medication Sig Dispense Refill  . aspirin EC 81 MG tablet Take 81 mg by mouth daily.      Marland Kitchen atenolol (TENORMIN) 25 MG tablet Take 50 mg by mouth 2 (two) times daily.       . Cholecalciferol (VITAMIN D-3) 5000 UNITS TABS Take 1 tablet by mouth daily.      . Coenzyme Q10 (CO Q 10 PO) Take 1 tablet by mouth daily.      . CYANOCOBALAMIN IJ Inject 100 mg as directed once a week. Usually on Wednesday       . estradiol (ESTRACE) 0.5 MG tablet Take 0.5 mg by mouth daily.      . medroxyPROGESTERone (PROVERA) 2.5 MG tablet Take 2.5 mg by mouth daily.      . Multiple Vitamin (MULTIVITAMIN WITH MINERALS) TABS Take 1 tablet by mouth daily.      . Omega-3 Fatty Acids (FISH OIL PO) Take 2 capsules by mouth 2 (two) times daily.      . Red Yeast Rice Extract (RED YEAST RICE PO) Take 1 tablet by mouth 2 (two) times daily.      . cetirizine (ZYRTEC) 10 MG tablet Take 10 mg by mouth daily.      Marland Kitchen omeprazole (PRILOSEC) 20 MG capsule Take 20 mg by mouth daily.      Marland Kitchen thyroid (ARMOUR) 30 MG tablet Take 30 mg by mouth daily.      Marland Kitchen trimethoprim (TRIMPEX) 100 MG tablet Take 100 mg by mouth 2 (two) times daily.        No current facility-administered medications for this visit.   REVIEW OF SYSTEMS: Arly.Keller ] denotes positive finding; [  ] denotes negative finding  CARDIOVASCULAR:  [ ]  chest pain   [ ]  chest pressure   Arly.Keller ] palpitations   [ ]  orthopnea   Arly.Keller ] dyspnea  on exertion   [ ]  claudication   [ ]  rest pain   [ ]  DVT   [ ]  phlebitis PULMONARY:   [ ]  productive cough   [ ]  asthma   [ ]  wheezing NEUROLOGIC:   [ ]  weakness  [ ]  paresthesias  [ ]  aphasia  [ ]  amaurosis  [ ]  dizziness HEMATOLOGIC:   [ ]  bleeding problems   [ ]  clotting disorders MUSCULOSKELETAL:  [ ]  joint pain   [ ]  joint swelling [ ]  leg swelling GASTROINTESTINAL: [ ]   blood in stool  [ ]   hematemesis GENITOURINARY:  [ ]   dysuria  [ ]   hematuria PSYCHIATRIC:  [ ]  history of major depression INTEGUMENTARY:  [ ]  rashes  [ ]  ulcers CONSTITUTIONAL:  [ ]  fever   [ ]  chills  PHYSICAL EXAM: Filed Vitals:   05/25/12 1042  BP: 134/75  Pulse: 81  Height: 5\' 9"  (1.753 m)  Weight: 169 lb 8 oz (76.885 kg)  SpO2: 99%   Body mass index is 25.02 kg/(m^2). GENERAL: The patient is a well-nourished female, in no acute distress. The vital signs are documented above. CARDIOVASCULAR: There is a regular rate and rhythm. I do not detect carotid bruits. He  has palpable femoral popliteal and pedal pulses bilaterally. PULMONARY: There is good air exchange bilaterally without wheezing or rales. ABDOMEN: Soft and non-tender with normal pitched bowel sounds. I cannot palpate an abdominal aortic aneurysm. MUSCULOSKELETAL: There are no major deformities or cyanosis. NEUROLOGIC: No focal weakness or paresthesias are detected. SKIN: There are no ulcers or rashes noted. PSYCHIATRIC: The patient has a normal affect.  MEDICAL ISSUES: Based on her exam and history she has no evidence of significant peripheral vascular disease. Fortunately she is not a smoker. Her primary care physician closely follows her blood pressure and cholesterol. She is on aspirin. And she does remain fairly active. I'll be happy to see her back at any time if any vascular issues arise.   DICKSON,CHRISTOPHER S [facility]: [phone]

## 2012-08-05 ENCOUNTER — Other Ambulatory Visit (HOSPITAL_COMMUNITY): Payer: Self-pay | Admitting: Internal Medicine

## 2012-08-05 DIAGNOSIS — M899 Disorder of bone, unspecified: Secondary | ICD-10-CM

## 2012-08-05 DIAGNOSIS — M8580 Other specified disorders of bone density and structure, unspecified site: Secondary | ICD-10-CM

## 2012-08-09 ENCOUNTER — Ambulatory Visit (HOSPITAL_COMMUNITY)
Admission: RE | Admit: 2012-08-09 | Discharge: 2012-08-09 | Disposition: A | Discharge status: Home / Self Care | Payer: BC Managed Care – PPO | Source: Ambulatory Visit | Attending: Internal Medicine | Admitting: Internal Medicine

## 2012-08-09 DIAGNOSIS — M949 Disorder of cartilage, unspecified: Secondary | ICD-10-CM

## 2012-08-09 DIAGNOSIS — M899 Disorder of bone, unspecified: Secondary | ICD-10-CM | POA: Insufficient documentation

## 2012-08-09 DIAGNOSIS — M8580 Other specified disorders of bone density and structure, unspecified site: Secondary | ICD-10-CM

## 2012-08-09 DIAGNOSIS — Z780 Asymptomatic menopausal state: Secondary | ICD-10-CM | POA: Insufficient documentation

## 2012-11-10 ENCOUNTER — Other Ambulatory Visit: Payer: Self-pay

## 2012-11-28 ENCOUNTER — Encounter: Payer: Self-pay | Admitting: Cardiology

## 2013-04-12 ENCOUNTER — Other Ambulatory Visit: Payer: Self-pay | Admitting: Radiology

## 2013-06-26 ENCOUNTER — Other Ambulatory Visit: Payer: Self-pay | Admitting: Gastroenterology

## 2014-05-30 ENCOUNTER — Ambulatory Visit (HOSPITAL_COMMUNITY)
Admission: RE | Admit: 2014-05-30 | Discharge: 2014-05-30 | Disposition: A | Discharge status: Home / Self Care | Payer: BC Managed Care – PPO | Source: Ambulatory Visit | Attending: Physician Assistant | Admitting: Physician Assistant

## 2014-05-30 ENCOUNTER — Other Ambulatory Visit (HOSPITAL_COMMUNITY): Payer: Self-pay | Admitting: Physician Assistant

## 2014-05-30 DIAGNOSIS — R0789 Other chest pain: Secondary | ICD-10-CM | POA: Diagnosis not present

## 2014-05-30 DIAGNOSIS — R0602 Shortness of breath: Secondary | ICD-10-CM

## 2014-06-04 ENCOUNTER — Other Ambulatory Visit (HOSPITAL_COMMUNITY): Payer: Self-pay | Admitting: Internal Medicine

## 2014-06-04 DIAGNOSIS — J984 Other disorders of lung: Secondary | ICD-10-CM

## 2014-06-08 ENCOUNTER — Ambulatory Visit (HOSPITAL_COMMUNITY)
Admission: RE | Admit: 2014-06-08 | Discharge: 2014-06-08 | Disposition: A | Discharge status: Home / Self Care | Payer: BC Managed Care – PPO | Source: Ambulatory Visit | Attending: Internal Medicine | Admitting: Internal Medicine

## 2014-06-08 DIAGNOSIS — R911 Solitary pulmonary nodule: Secondary | ICD-10-CM | POA: Insufficient documentation

## 2014-06-08 DIAGNOSIS — E049 Nontoxic goiter, unspecified: Secondary | ICD-10-CM | POA: Diagnosis not present

## 2014-06-08 DIAGNOSIS — J984 Other disorders of lung: Secondary | ICD-10-CM

## 2015-04-23 ENCOUNTER — Other Ambulatory Visit: Payer: Self-pay | Admitting: Obstetrics & Gynecology

## 2015-04-24 LAB — CYTOLOGY - PAP

## 2017-02-10 DIAGNOSIS — Z1231 Encounter for screening mammogram for malignant neoplasm of breast: Secondary | ICD-10-CM | POA: Diagnosis not present

## 2017-03-09 DIAGNOSIS — R69 Illness, unspecified: Secondary | ICD-10-CM | POA: Diagnosis not present

## 2017-04-22 DIAGNOSIS — L259 Unspecified contact dermatitis, unspecified cause: Secondary | ICD-10-CM | POA: Diagnosis not present

## 2017-05-24 DIAGNOSIS — E559 Vitamin D deficiency, unspecified: Secondary | ICD-10-CM | POA: Diagnosis not present

## 2017-05-24 DIAGNOSIS — D519 Vitamin B12 deficiency anemia, unspecified: Secondary | ICD-10-CM | POA: Diagnosis not present

## 2017-05-24 DIAGNOSIS — E782 Mixed hyperlipidemia: Secondary | ICD-10-CM | POA: Diagnosis not present

## 2017-05-25 DIAGNOSIS — Z01419 Encounter for gynecological examination (general) (routine) without abnormal findings: Secondary | ICD-10-CM | POA: Diagnosis not present

## 2017-05-25 DIAGNOSIS — Z6825 Body mass index (BMI) 25.0-25.9, adult: Secondary | ICD-10-CM | POA: Diagnosis not present

## 2017-05-25 DIAGNOSIS — Z124 Encounter for screening for malignant neoplasm of cervix: Secondary | ICD-10-CM | POA: Diagnosis not present

## 2017-05-26 DIAGNOSIS — E782 Mixed hyperlipidemia: Secondary | ICD-10-CM | POA: Diagnosis not present

## 2017-05-26 DIAGNOSIS — R159 Full incontinence of feces: Secondary | ICD-10-CM | POA: Diagnosis not present

## 2017-05-26 DIAGNOSIS — Z0000 Encounter for general adult medical examination without abnormal findings: Secondary | ICD-10-CM | POA: Diagnosis not present

## 2017-05-26 DIAGNOSIS — I10 Essential (primary) hypertension: Secondary | ICD-10-CM | POA: Diagnosis not present

## 2017-05-26 DIAGNOSIS — R69 Illness, unspecified: Secondary | ICD-10-CM | POA: Diagnosis not present

## 2017-05-26 DIAGNOSIS — Z6825 Body mass index (BMI) 25.0-25.9, adult: Secondary | ICD-10-CM | POA: Diagnosis not present

## 2017-05-26 DIAGNOSIS — R002 Palpitations: Secondary | ICD-10-CM | POA: Diagnosis not present

## 2017-06-25 DIAGNOSIS — M9903 Segmental and somatic dysfunction of lumbar region: Secondary | ICD-10-CM | POA: Diagnosis not present

## 2017-06-25 DIAGNOSIS — M9901 Segmental and somatic dysfunction of cervical region: Secondary | ICD-10-CM | POA: Diagnosis not present

## 2017-06-25 DIAGNOSIS — M9902 Segmental and somatic dysfunction of thoracic region: Secondary | ICD-10-CM | POA: Diagnosis not present

## 2017-06-25 DIAGNOSIS — R42 Dizziness and giddiness: Secondary | ICD-10-CM | POA: Diagnosis not present

## 2017-06-25 DIAGNOSIS — S134XXA Sprain of ligaments of cervical spine, initial encounter: Secondary | ICD-10-CM | POA: Diagnosis not present

## 2017-09-09 DIAGNOSIS — R69 Illness, unspecified: Secondary | ICD-10-CM | POA: Diagnosis not present

## 2017-12-09 DIAGNOSIS — Z1321 Encounter for screening for nutritional disorder: Secondary | ICD-10-CM | POA: Diagnosis not present

## 2017-12-09 DIAGNOSIS — E782 Mixed hyperlipidemia: Secondary | ICD-10-CM | POA: Diagnosis not present

## 2017-12-09 DIAGNOSIS — R69 Illness, unspecified: Secondary | ICD-10-CM | POA: Diagnosis not present

## 2017-12-09 DIAGNOSIS — I10 Essential (primary) hypertension: Secondary | ICD-10-CM | POA: Diagnosis not present

## 2017-12-09 DIAGNOSIS — E559 Vitamin D deficiency, unspecified: Secondary | ICD-10-CM | POA: Diagnosis not present

## 2017-12-09 DIAGNOSIS — E785 Hyperlipidemia, unspecified: Secondary | ICD-10-CM | POA: Diagnosis not present

## 2017-12-13 DIAGNOSIS — L570 Actinic keratosis: Secondary | ICD-10-CM | POA: Diagnosis not present

## 2017-12-13 DIAGNOSIS — L821 Other seborrheic keratosis: Secondary | ICD-10-CM | POA: Diagnosis not present

## 2017-12-15 DIAGNOSIS — R69 Illness, unspecified: Secondary | ICD-10-CM | POA: Diagnosis not present

## 2017-12-15 DIAGNOSIS — E782 Mixed hyperlipidemia: Secondary | ICD-10-CM | POA: Diagnosis not present

## 2017-12-15 DIAGNOSIS — I10 Essential (primary) hypertension: Secondary | ICD-10-CM | POA: Diagnosis not present

## 2017-12-15 DIAGNOSIS — R159 Full incontinence of feces: Secondary | ICD-10-CM | POA: Diagnosis not present

## 2017-12-15 DIAGNOSIS — D510 Vitamin B12 deficiency anemia due to intrinsic factor deficiency: Secondary | ICD-10-CM | POA: Diagnosis not present

## 2018-03-14 DIAGNOSIS — R69 Illness, unspecified: Secondary | ICD-10-CM | POA: Diagnosis not present

## 2018-03-14 DIAGNOSIS — Z803 Family history of malignant neoplasm of breast: Secondary | ICD-10-CM | POA: Diagnosis not present

## 2018-03-14 DIAGNOSIS — Z1231 Encounter for screening mammogram for malignant neoplasm of breast: Secondary | ICD-10-CM | POA: Diagnosis not present

## 2018-03-22 DIAGNOSIS — R69 Illness, unspecified: Secondary | ICD-10-CM | POA: Diagnosis not present

## 2018-05-16 DIAGNOSIS — Z0000 Encounter for general adult medical examination without abnormal findings: Secondary | ICD-10-CM | POA: Diagnosis not present

## 2018-06-10 DIAGNOSIS — E785 Hyperlipidemia, unspecified: Secondary | ICD-10-CM | POA: Diagnosis not present

## 2018-06-10 DIAGNOSIS — D510 Vitamin B12 deficiency anemia due to intrinsic factor deficiency: Secondary | ICD-10-CM | POA: Diagnosis not present

## 2018-06-10 DIAGNOSIS — I10 Essential (primary) hypertension: Secondary | ICD-10-CM | POA: Diagnosis not present

## 2018-06-10 DIAGNOSIS — E782 Mixed hyperlipidemia: Secondary | ICD-10-CM | POA: Diagnosis not present

## 2018-06-13 DIAGNOSIS — L237 Allergic contact dermatitis due to plants, except food: Secondary | ICD-10-CM | POA: Diagnosis not present

## 2018-06-17 DIAGNOSIS — D510 Vitamin B12 deficiency anemia due to intrinsic factor deficiency: Secondary | ICD-10-CM | POA: Diagnosis not present

## 2018-06-17 DIAGNOSIS — R7301 Impaired fasting glucose: Secondary | ICD-10-CM | POA: Diagnosis not present

## 2018-06-17 DIAGNOSIS — E782 Mixed hyperlipidemia: Secondary | ICD-10-CM | POA: Diagnosis not present

## 2018-06-17 DIAGNOSIS — I10 Essential (primary) hypertension: Secondary | ICD-10-CM | POA: Diagnosis not present

## 2018-06-17 DIAGNOSIS — E559 Vitamin D deficiency, unspecified: Secondary | ICD-10-CM | POA: Diagnosis not present

## 2018-06-17 DIAGNOSIS — R69 Illness, unspecified: Secondary | ICD-10-CM | POA: Diagnosis not present

## 2018-06-17 DIAGNOSIS — Z0001 Encounter for general adult medical examination with abnormal findings: Secondary | ICD-10-CM | POA: Diagnosis not present

## 2018-06-17 DIAGNOSIS — H9313 Tinnitus, bilateral: Secondary | ICD-10-CM | POA: Diagnosis not present

## 2018-06-17 DIAGNOSIS — L255 Unspecified contact dermatitis due to plants, except food: Secondary | ICD-10-CM | POA: Diagnosis not present

## 2018-07-18 DIAGNOSIS — M9903 Segmental and somatic dysfunction of lumbar region: Secondary | ICD-10-CM | POA: Diagnosis not present

## 2018-07-18 DIAGNOSIS — R42 Dizziness and giddiness: Secondary | ICD-10-CM | POA: Diagnosis not present

## 2018-07-18 DIAGNOSIS — M9902 Segmental and somatic dysfunction of thoracic region: Secondary | ICD-10-CM | POA: Diagnosis not present

## 2018-07-18 DIAGNOSIS — M9901 Segmental and somatic dysfunction of cervical region: Secondary | ICD-10-CM | POA: Diagnosis not present

## 2018-07-18 DIAGNOSIS — S134XXA Sprain of ligaments of cervical spine, initial encounter: Secondary | ICD-10-CM | POA: Diagnosis not present

## 2018-07-20 DIAGNOSIS — M9901 Segmental and somatic dysfunction of cervical region: Secondary | ICD-10-CM | POA: Diagnosis not present

## 2018-07-20 DIAGNOSIS — R42 Dizziness and giddiness: Secondary | ICD-10-CM | POA: Diagnosis not present

## 2018-07-20 DIAGNOSIS — S134XXA Sprain of ligaments of cervical spine, initial encounter: Secondary | ICD-10-CM | POA: Diagnosis not present

## 2018-07-20 DIAGNOSIS — M9903 Segmental and somatic dysfunction of lumbar region: Secondary | ICD-10-CM | POA: Diagnosis not present

## 2018-07-20 DIAGNOSIS — M9902 Segmental and somatic dysfunction of thoracic region: Secondary | ICD-10-CM | POA: Diagnosis not present

## 2018-09-19 DIAGNOSIS — R69 Illness, unspecified: Secondary | ICD-10-CM | POA: Diagnosis not present

## 2018-12-14 DIAGNOSIS — L570 Actinic keratosis: Secondary | ICD-10-CM | POA: Diagnosis not present

## 2018-12-14 DIAGNOSIS — L821 Other seborrheic keratosis: Secondary | ICD-10-CM | POA: Diagnosis not present

## 2018-12-14 DIAGNOSIS — D1801 Hemangioma of skin and subcutaneous tissue: Secondary | ICD-10-CM | POA: Diagnosis not present

## 2018-12-15 DIAGNOSIS — D510 Vitamin B12 deficiency anemia due to intrinsic factor deficiency: Secondary | ICD-10-CM | POA: Diagnosis not present

## 2018-12-15 DIAGNOSIS — I10 Essential (primary) hypertension: Secondary | ICD-10-CM | POA: Diagnosis not present

## 2018-12-15 DIAGNOSIS — E559 Vitamin D deficiency, unspecified: Secondary | ICD-10-CM | POA: Diagnosis not present

## 2018-12-15 DIAGNOSIS — R7301 Impaired fasting glucose: Secondary | ICD-10-CM | POA: Diagnosis not present

## 2018-12-15 DIAGNOSIS — E782 Mixed hyperlipidemia: Secondary | ICD-10-CM | POA: Diagnosis not present

## 2018-12-15 DIAGNOSIS — E785 Hyperlipidemia, unspecified: Secondary | ICD-10-CM | POA: Diagnosis not present

## 2019-02-06 DIAGNOSIS — D510 Vitamin B12 deficiency anemia due to intrinsic factor deficiency: Secondary | ICD-10-CM | POA: Diagnosis not present

## 2019-02-06 DIAGNOSIS — R159 Full incontinence of feces: Secondary | ICD-10-CM | POA: Diagnosis not present

## 2019-02-06 DIAGNOSIS — I10 Essential (primary) hypertension: Secondary | ICD-10-CM | POA: Diagnosis not present

## 2019-02-06 DIAGNOSIS — E7849 Other hyperlipidemia: Secondary | ICD-10-CM | POA: Diagnosis not present

## 2019-02-06 DIAGNOSIS — R69 Illness, unspecified: Secondary | ICD-10-CM | POA: Diagnosis not present

## 2019-03-20 DIAGNOSIS — Z1231 Encounter for screening mammogram for malignant neoplasm of breast: Secondary | ICD-10-CM | POA: Diagnosis not present

## 2019-04-14 DIAGNOSIS — H524 Presbyopia: Secondary | ICD-10-CM | POA: Diagnosis not present

## 2019-04-14 DIAGNOSIS — H47093 Other disorders of optic nerve, not elsewhere classified, bilateral: Secondary | ICD-10-CM | POA: Diagnosis not present

## 2019-04-17 DIAGNOSIS — R69 Illness, unspecified: Secondary | ICD-10-CM | POA: Diagnosis not present

## 2019-04-30 DIAGNOSIS — Z20828 Contact with and (suspected) exposure to other viral communicable diseases: Secondary | ICD-10-CM | POA: Diagnosis not present

## 2019-05-16 DIAGNOSIS — R69 Illness, unspecified: Secondary | ICD-10-CM | POA: Diagnosis not present

## 2019-06-06 DIAGNOSIS — Z01419 Encounter for gynecological examination (general) (routine) without abnormal findings: Secondary | ICD-10-CM | POA: Diagnosis not present

## 2019-06-06 DIAGNOSIS — Z6825 Body mass index (BMI) 25.0-25.9, adult: Secondary | ICD-10-CM | POA: Diagnosis not present

## 2019-06-06 DIAGNOSIS — Z124 Encounter for screening for malignant neoplasm of cervix: Secondary | ICD-10-CM | POA: Diagnosis not present

## 2019-06-14 DIAGNOSIS — Z1211 Encounter for screening for malignant neoplasm of colon: Secondary | ICD-10-CM | POA: Diagnosis not present

## 2019-06-21 LAB — COLOGUARD: COLOGUARD: NEGATIVE

## 2019-06-21 LAB — EXTERNAL GENERIC LAB PROCEDURE: COLOGUARD: NEGATIVE

## 2019-09-12 DIAGNOSIS — Z20828 Contact with and (suspected) exposure to other viral communicable diseases: Secondary | ICD-10-CM | POA: Diagnosis not present

## 2019-10-05 DIAGNOSIS — R69 Illness, unspecified: Secondary | ICD-10-CM | POA: Diagnosis not present

## 2019-11-09 DIAGNOSIS — G4730 Sleep apnea, unspecified: Secondary | ICD-10-CM | POA: Diagnosis not present

## 2019-12-11 DIAGNOSIS — I10 Essential (primary) hypertension: Secondary | ICD-10-CM | POA: Diagnosis not present

## 2019-12-11 DIAGNOSIS — G4733 Obstructive sleep apnea (adult) (pediatric): Secondary | ICD-10-CM | POA: Diagnosis not present

## 2019-12-11 DIAGNOSIS — R69 Illness, unspecified: Secondary | ICD-10-CM | POA: Diagnosis not present

## 2019-12-11 DIAGNOSIS — R159 Full incontinence of feces: Secondary | ICD-10-CM | POA: Diagnosis not present

## 2019-12-11 DIAGNOSIS — D510 Vitamin B12 deficiency anemia due to intrinsic factor deficiency: Secondary | ICD-10-CM | POA: Diagnosis not present

## 2019-12-11 DIAGNOSIS — E7849 Other hyperlipidemia: Secondary | ICD-10-CM | POA: Diagnosis not present

## 2019-12-13 DIAGNOSIS — L821 Other seborrheic keratosis: Secondary | ICD-10-CM | POA: Diagnosis not present

## 2019-12-13 DIAGNOSIS — I781 Nevus, non-neoplastic: Secondary | ICD-10-CM | POA: Diagnosis not present

## 2019-12-13 DIAGNOSIS — L570 Actinic keratosis: Secondary | ICD-10-CM | POA: Diagnosis not present

## 2019-12-15 DIAGNOSIS — L255 Unspecified contact dermatitis due to plants, except food: Secondary | ICD-10-CM | POA: Diagnosis not present

## 2019-12-15 DIAGNOSIS — Z712 Person consulting for explanation of examination or test findings: Secondary | ICD-10-CM | POA: Diagnosis not present

## 2019-12-15 DIAGNOSIS — I10 Essential (primary) hypertension: Secondary | ICD-10-CM | POA: Diagnosis not present

## 2019-12-15 DIAGNOSIS — Z6825 Body mass index (BMI) 25.0-25.9, adult: Secondary | ICD-10-CM | POA: Diagnosis not present

## 2019-12-15 DIAGNOSIS — E785 Hyperlipidemia, unspecified: Secondary | ICD-10-CM | POA: Diagnosis not present

## 2019-12-15 DIAGNOSIS — E559 Vitamin D deficiency, unspecified: Secondary | ICD-10-CM | POA: Diagnosis not present

## 2019-12-15 DIAGNOSIS — Z0001 Encounter for general adult medical examination with abnormal findings: Secondary | ICD-10-CM | POA: Diagnosis not present

## 2019-12-15 DIAGNOSIS — R002 Palpitations: Secondary | ICD-10-CM | POA: Diagnosis not present

## 2019-12-15 DIAGNOSIS — R159 Full incontinence of feces: Secondary | ICD-10-CM | POA: Diagnosis not present

## 2019-12-15 DIAGNOSIS — D510 Vitamin B12 deficiency anemia due to intrinsic factor deficiency: Secondary | ICD-10-CM | POA: Diagnosis not present

## 2019-12-15 DIAGNOSIS — R7301 Impaired fasting glucose: Secondary | ICD-10-CM | POA: Diagnosis not present

## 2020-03-25 DIAGNOSIS — Z1231 Encounter for screening mammogram for malignant neoplasm of breast: Secondary | ICD-10-CM | POA: Diagnosis not present

## 2020-05-23 ENCOUNTER — Encounter: Payer: Self-pay | Admitting: Emergency Medicine

## 2020-05-23 ENCOUNTER — Other Ambulatory Visit: Payer: Self-pay

## 2020-05-23 ENCOUNTER — Ambulatory Visit
Admission: EM | Admit: 2020-05-23 | Discharge: 2020-05-23 | Disposition: A | Discharge status: Home / Self Care | Payer: Medicare HMO

## 2020-05-23 DIAGNOSIS — Z20822 Contact with and (suspected) exposure to covid-19: Secondary | ICD-10-CM

## 2020-05-23 DIAGNOSIS — J069 Acute upper respiratory infection, unspecified: Secondary | ICD-10-CM

## 2020-05-23 MED ORDER — BENZONATATE 100 MG PO CAPS: 100.0000 mg | ORAL_CAPSULE | Freq: Three times a day (TID) | ORAL | 0 refills | Status: DC

## 2020-05-23 NOTE — ED Provider Notes (Signed)
[place]   [id number] 05/23/20 Arrival Time: 1628   CC: COVID symptoms  SUBJECTIVE: History from: patient and family.  BARB SHEAR is a 70 y.o. female who presents with low grade fever, headache, and congestion that began this morning.  Denies sick exposure to COVID, flu or strep.  Has tried OTC medications without relief.  Denies aggravating factors.  Denies previous symptoms in the past.   Denies fever, chills, SOB, wheezing, chest pain, nausea, changes in bowel or bladder habits.     ROS: As per HPI.  All other pertinent ROS negative.     Past Medical History:  Diagnosis Date  . Anemia   . Atrial septal aneurysm    Incidental finding, echo, 2008  . Chest pain    Myoview in the past, no definite ischemia,, possible inferior scar   //   cardiac catheterization July 19, 2007,  no significant coronary disease,  EF 55-60%  . Ejection fraction    EF 50-55%, echo, February, 2008,  no Hasan motion abnormalities     EF 55-60%, catheterization, 2009  . GERD (gastroesophageal reflux disease)   . Hypertension   . Palpitations    Monitor in 2000 showed sinus rhythm and sinus tachycardia. There were occasional supraventricular beats. She was treated with beta blocker   Past Surgical History:  Procedure Laterality Date  . KNEE RECONSTRUCTION Right 2008  . TUBAL LIGATION     Allergies  Allergen Reactions  . Elemental Sulfur Hives   No current facility-administered medications on file prior to encounter.   Current Outpatient Medications on File Prior to Encounter  Medication Sig Dispense Refill  . pyridoxine (B-6) 250 MG tablet Take 250 mg by mouth daily.    Marland Kitchen zinc gluconate 50 MG tablet Take 50 mg by mouth daily.    Marland Kitchen aspirin EC 81 MG tablet Take 81 mg by mouth daily.    Marland Kitchen atenolol (TENORMIN) 25 MG tablet Take 50 mg by mouth 2 (two) times daily.     . Cholecalciferol (VITAMIN D-3) 5000 UNITS TABS Take 1 tablet by mouth daily.    . Coenzyme Q10 (CO Q 10 PO) Take 1  tablet by mouth daily.    . CYANOCOBALAMIN IJ Inject 100 mg as directed once a week. Usually on Wednesday    . estradiol (ESTRACE) 0.5 MG tablet Take 0.5 mg by mouth daily.    . Multiple Vitamin (MULTIVITAMIN WITH MINERALS) TABS Take 1 tablet by mouth daily.    . Omega-3 Fatty Acids (FISH OIL PO) Take 2 capsules by mouth 2 (two) times daily.    . Red Yeast Rice Extract (RED YEAST RICE PO) Take 1 tablet by mouth 2 (two) times daily.    . [DISCONTINUED] cetirizine (ZYRTEC) 10 MG tablet Take 10 mg by mouth daily.    . [DISCONTINUED] medroxyPROGESTERone (PROVERA) 2.5 MG tablet Take 2.5 mg by mouth daily.    . [DISCONTINUED] omeprazole (PRILOSEC) 20 MG capsule Take 20 mg by mouth daily.    . [DISCONTINUED] thyroid (ARMOUR) 30 MG tablet Take 30 mg by mouth daily.     Social History   Socioeconomic History  . Marital status: Married    Spouse name: Not on file  . Number of children: Not on file  . Years of education: Not on file  . Highest education level: Not on file  Occupational History  . Not on file  Tobacco Use  . Smoking status: Never Smoker  . Smokeless tobacco: Never Used  Substance and Sexual Activity  . Alcohol use: No  . Drug use: Not on file  . Sexual activity: Not on file  Other Topics Concern  . Not on file  Social History Narrative  . Not on file   Social Determinants of Health   Financial Resource Strain: Not on file  Food Insecurity: Not on file  Transportation Needs: Not on file  Physical Activity: Not on file  Stress: Not on file  Social Connections: Not on file  Intimate Partner Violence: Not on file   Family History  Problem Relation Age of Onset  . Heart attack Mother   . Heart attack Father     OBJECTIVE:  Vitals:   05/23/20 1800 05/23/20 1801  BP:  135/86  Pulse: (!) 105   Resp: 18   Temp: 98.1 F (36.7 C)   TempSrc: Temporal   SpO2: 96%      General appearance: alert; fatigued appearing, nontoxic; speaking in full sentences and  tolerating own secretions HEENT: NCAT; Ears: EACs clear, TMs pearly gray; Eyes: PERRL.  EOM grossly intact.Nose: nares patent without rhinorrhea, Throat: oropharynx clear, tonsils non erythematous or enlarged, uvula midline  Neck: supple without LAD Lungs: unlabored respirations, symmetrical air entry; cough: absent; no respiratory distress; CTAB Heart: regular rate and rhythm.  Skin: warm and dry Psychological: alert and cooperative; normal mood and affect   ASSESSMENT & PLAN:  1. Exposure to COVID-19 virus   2. Viral URI with cough     Meds ordered this encounter  Medications  . benzonatate (TESSALON) 100 MG capsule    Sig: Take 1 capsule (100 mg total) by mouth every 8 (eight) hours.    Dispense:  21 capsule    Refill:  0    Order Specific Question:   Supervising Provider    Answer:   Raylene Everts [[id number]]   COVID testing ordered.  It will take between 5-7 days for test results.  Someone will contact you regarding abnormal results.    In the meantime: You should remain isolated in your home for 10 days from symptom onset AND greater than 72 hours after symptoms resolution (absence of fever without the use of fever-reducing medication and improvement in respiratory symptoms), whichever is longer Get plenty of rest and push fluids Tessalon Perles as needed for cough Use OTC zyrtec for nasal congestion, runny nose, and/or sore throat Use OTC flonase for nasal congestion and runny nose Use medications daily for symptom relief Use OTC medications like ibuprofen or tylenol as needed fever or pain Call or go to the ED if you have any new or worsening symptoms such as fever, worsening cough, shortness of breath, chest tightness, chest pain, turning blue, changes in mental status, etc...   Reviewed expectations re: course of current medical issues. Questions answered. Outlined signs and symptoms indicating need for more acute intervention. Patient verbalized  understanding. After Visit Summary given.         Lestine Box, PA-C 05/23/20 1914

## 2020-05-23 NOTE — Discharge Instructions (Signed)
COVID testing ordered.  It will take between 5-7 days for test results.  Someone will contact you regarding abnormal results.    In the meantime: You should remain isolated in your home for 10 days from symptom onset AND greater than 72 hours after symptoms resolution (absence of fever without the use of fever-reducing medication and improvement in respiratory symptoms), whichever is longer Get plenty of rest and push fluids Tessalon Perles as needed for cough Use OTC zyrtec for nasal congestion, runny nose, and/or sore throat Use OTC flonase for nasal congestion and runny nose Use medications daily for symptom relief Use OTC medications like ibuprofen or tylenol as needed fever or pain Call or go to the ED if you have any new or worsening symptoms such as fever, worsening cough, shortness of breath, chest tightness, chest pain, turning blue, changes in mental status, etc..Marland Kitchen

## 2020-05-23 NOTE — ED Triage Notes (Signed)
Low grade fever, headache and congestion since this morning.

## 2020-05-24 ENCOUNTER — Telehealth (HOSPITAL_COMMUNITY): Payer: Self-pay | Admitting: Internal Medicine

## 2020-05-24 LAB — COVID-19, FLU A+B NAA
Influenza A, NAA: NOT DETECTED
Influenza B, NAA: NOT DETECTED
SARS-CoV-2, NAA: DETECTED — AB

## 2020-05-24 MED ORDER — MOLNUPIRAVIR EUA 200MG CAPSULE: 4.0000 | ORAL_CAPSULE | Freq: Two times a day (BID) | ORAL | 0 refills | Status: AC

## 2020-05-24 MED ORDER — MOLNUPIRAVIR EUA 200MG CAPSULE: 4.0000 | ORAL_CAPSULE | Freq: Two times a day (BID) | ORAL | 0 refills | Status: DC

## 2020-05-24 NOTE — Telephone Encounter (Signed)
covid positive with significant risk for severe disease

## 2020-05-24 NOTE — Telephone Encounter (Signed)
Wanted to resend prescription to [facility].  This Rn explained possibly wont have medicine, she wants to try.

## 2020-09-20 DIAGNOSIS — R7303 Prediabetes: Secondary | ICD-10-CM | POA: Diagnosis not present

## 2020-09-20 DIAGNOSIS — R42 Dizziness and giddiness: Secondary | ICD-10-CM | POA: Diagnosis not present

## 2020-09-20 DIAGNOSIS — H9203 Otalgia, bilateral: Secondary | ICD-10-CM | POA: Diagnosis not present

## 2020-09-20 DIAGNOSIS — R000 Tachycardia, unspecified: Secondary | ICD-10-CM | POA: Diagnosis not present

## 2020-09-20 DIAGNOSIS — E782 Mixed hyperlipidemia: Secondary | ICD-10-CM | POA: Diagnosis not present

## 2020-11-17 ENCOUNTER — Ambulatory Visit
Admission: EM | Admit: 2020-11-17 | Discharge: 2020-11-17 | Disposition: A | Discharge status: Home / Self Care | Payer: Medicare HMO

## 2020-11-17 ENCOUNTER — Other Ambulatory Visit: Payer: Self-pay

## 2020-11-17 ENCOUNTER — Encounter: Payer: Self-pay | Admitting: Emergency Medicine

## 2020-11-17 DIAGNOSIS — J069 Acute upper respiratory infection, unspecified: Secondary | ICD-10-CM | POA: Diagnosis not present

## 2020-11-17 MED ORDER — PROMETHAZINE-DM 6.25-15 MG/5ML PO SYRP: 5.0000 mL | ORAL_SOLUTION | Freq: Four times a day (QID) | ORAL | 0 refills | Status: DC | PRN

## 2020-11-17 MED ORDER — PREDNISONE 20 MG PO TABS: 40.0000 mg | ORAL_TABLET | Freq: Every day | ORAL | 0 refills | Status: DC

## 2020-11-17 MED ORDER — FLUTICASONE PROPIONATE 50 MCG/ACT NA SUSP: 1.0000 | Freq: Two times a day (BID) | NASAL | 2 refills | Status: DC

## 2020-11-17 NOTE — ED Provider Notes (Signed)
[facility]    CSN: [id number] Arrival date & time: 11/17/20  1856      History   Chief Complaint Chief Complaint  Patient presents with   Nasal Congestion   Fever   Cough    HPI Julia [name] is a 70 y.o. female.   Presenting today with 4-day history of congestion, cough, low-grade fevers, chest tightness, shortness of breath.  Denies chest pain, shortness of breath, abdominal pain, nausea vomiting or diarrhea.  She taking Tylenol, Allegra, Mucinex with no relief.  Multiple sick contacts recently.  Home COVID test yesterday negative.  No known pertinent chronic pulmonary disease including asthma, COPD.   Past Medical History:  Diagnosis Date   Anemia    Atrial septal aneurysm    Incidental finding, echo, 2008   Chest pain    Myoview in the past, no definite ischemia,, possible inferior scar   //   cardiac catheterization July 19, 2007,  no significant coronary disease,  EF 55-60%   Ejection fraction    EF 50-55%, echo, February, 2008,  no Adelstein motion abnormalities     EF 55-60%, catheterization, 2009   GERD (gastroesophageal reflux disease)    Hypertension    Palpitations    Monitor in 2000 showed sinus rhythm and sinus tachycardia. There were occasional supraventricular beats. 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      Home Medications    Prior to Admission medications   Medication Sig Start Date End Date Taking? Authorizing Provider  fexofenadine (ALLEGRA) 180 MG tablet Take 180 mg by mouth daily.   Yes [provider]  fluticasone (FLONASE) 50 MCG/ACT nasal spray Place 1 spray into both nostrils 2 (two) times daily. 11/17/20  Yes Volney American, PA-C  predniSONE (DELTASONE) 20 MG tablet Take 2 tablets (40 mg total) by mouth daily with breakfast. 11/17/20  Yes Volney American, PA-C  promethazine-dextromethorphan (PROMETHAZINE-DM) 6.25-15 MG/5ML syrup Take 5 mLs by mouth 4 (four) times daily as needed for cough. 11/17/20  Yes Volney American, PA-C  aspirin EC 81 MG tablet Take 81 mg by mouth daily.    [provider]  atenolol (TENORMIN) 25 MG tablet Take 50 mg by mouth 2 (two) times daily.     [provider]  benzonatate (TESSALON) 100 MG capsule Take 1 capsule (100 mg total) by mouth every 8 (eight) hours. 05/23/20   [place], [place], PA-C  Cholecalciferol (VITAMIN D-3) 5000 UNITS TABS Take 1 tablet by mouth daily.    [provider]  Coenzyme Q10 (CO Q 10 PO) Take 1 tablet by mouth daily.    Barry Dienes, NP  CYANOCOBALAMIN IJ Inject 100  mg as directed once a week. Usually on Wednesday    [provider]  estradiol (ESTRACE) 0.5 MG tablet Take 0.5 mg by mouth daily.    [provider]  Multiple Vitamin (MULTIVITAMIN WITH MINERALS) TABS Take 1 tablet by mouth daily.    [provider]  Omega-3 Fatty Acids (FISH OIL PO) Take 2 capsules by mouth 2 (two) times daily.    [provider]  pyridoxine (B-6) 250 MG tablet Take 250 mg by mouth daily.    [provider]  Red Yeast Rice Extract (RED YEAST RICE PO) Take 1 tablet by mouth 2 (two) times daily.    [provider]  zinc gluconate 50 MG  tablet Take 50 mg by mouth daily.    [provider]  cetirizine (ZYRTEC) 10 MG tablet Take 10 mg by mouth daily.  05/23/20  [provider]  medroxyPROGESTERone (PROVERA) 2.5 MG tablet Take 2.5 mg by mouth daily.  05/23/20  [provider]  omeprazole (PRILOSEC) 20 MG capsule Take 20 mg by mouth daily.  05/23/20  [provider]  thyroid (ARMOUR) 30 MG tablet Take 30 mg by mouth daily.  05/23/20  [provider]    Family History Family History  Problem Relation Age of Onset   Heart attack Mother    Heart attack Father     Social History Social History   Tobacco Use   Smoking status: Never   Smokeless tobacco: Never  Substance Use Topics   Alcohol use: No     Allergies   Elemental sulfur   Review of Systems Review of Systems Per HPI  Physical Exam Triage Vital Signs ED Triage Vitals  Enc Vitals Group     BP 11/17/20 1156 (!) 153/96     Pulse Rate 11/17/20 1156 (!) 106     Resp 11/17/20 1156 18     Temp 11/17/20 1156 99.6 F (37.6 C)     Temp Source 11/17/20 1156 Oral     SpO2 11/17/20 1156 97 %     Weight --      Height --      Head Circumference --      Peak Flow --      Pain Score 11/17/20 1157 3     Pain Loc --      Pain Edu? --      Excl. in [place]? --    No data found.  Updated Vital Signs BP (!) 153/96 (BP Location: Right Arm)   Pulse (!) 106   Temp 99.6 F (37.6 C) (Oral)   Resp 18   SpO2 97%   Visual Acuity Right Eye Distance:   Left Eye Distance:   Bilateral Distance:    Right Eye Near:   Left Eye Near:    Bilateral Near:     Physical Exam Vitals and nursing note reviewed.  Constitutional:      Appearance: Normal appearance. She is not ill-appearing.  HENT:     Head: Atraumatic.     Right Ear: Tympanic membrane normal.     Left Ear: Tympanic membrane normal.     Nose: Nose normal.     Mouth/Throat:     Mouth: Mucous membranes are moist.     Pharynx: Posterior oropharyngeal erythema  present.  Eyes:     Extraocular Movements: Extraocular movements intact.     Conjunctiva/sclera: Conjunctivae normal.  Cardiovascular:     Rate and Rhythm: Normal rate and regular rhythm.  Heart sounds: Normal heart sounds.  Pulmonary:     Effort: Pulmonary effort is normal. No respiratory distress.     Breath sounds: Normal breath sounds. No wheezing or rales.  Musculoskeletal:        General: Normal range of motion.     Cervical back: Normal range of motion and neck supple.  Skin:    General: Skin is warm and dry.  Neurological:     Mental Status: She is alert and oriented to person, place, and time.     Motor: No weakness.     Gait: Gait normal.  Psychiatric:        Mood and Affect: Mood normal.        Thought Content: Thought content normal.        Judgment: Judgment normal.     UC Treatments / Results  Labs (all labs ordered are listed, but only abnormal results are displayed) Labs Reviewed - No data to display  EKG   Radiology No results found.  Procedures Procedures (including critical care time)  Medications Ordered in UC Medications - No data to display  Initial Impression / Assessment and Plan / UC Course  I have reviewed the triage vital signs and the nursing notes.  Pertinent labs & imaging results that were available during my care of the patient were reviewed by me and considered in my medical decision making (see chart for details).     Mildly tachycardic in triage, likely secondary to viral illness.  Otherwise vitals and exam overall reassuring.  Suspect influenza-like illness.  Given her symptoms we will treat with short prednisone burst, Phenergan DM, Flonase, Mucinex and other supportive over-the-counter medications and home care.  Return for acutely worsening symptoms.  Final Clinical Impressions(s) / UC Diagnoses   Final diagnoses:  Viral URI with cough   Discharge Instructions   None    ED Prescriptions     Medication Sig  Dispense Auth. Provider   predniSONE (DELTASONE) 20 MG tablet Take 2 tablets (40 mg total) by mouth daily with breakfast. 6 tablet Volney American, PA-C   promethazine-dextromethorphan (PROMETHAZINE-DM) 6.25-15 MG/5ML syrup Take 5 mLs by mouth 4 (four) times daily as needed for cough. 100 mL Volney American, PA-C   fluticasone [facility]) 50 MCG/ACT nasal spray Place 1 spray into both nostrils 2 (two) times daily. 16 g Volney American, [place]      PDMP not reviewed this encounter.   Volney American, [place] 11/17/20 1236

## 2020-11-17 NOTE — ED Triage Notes (Signed)
Pt presents today with c/o nasal congestion, cough and low grade temp since Thursday x 4 days. She took Tylenol at approx 830a

## 2020-12-20 DIAGNOSIS — H47093 Other disorders of optic nerve, not elsewhere classified, bilateral: Secondary | ICD-10-CM | POA: Diagnosis not present

## 2021-01-09 DIAGNOSIS — D510 Vitamin B12 deficiency anemia due to intrinsic factor deficiency: Secondary | ICD-10-CM | POA: Diagnosis not present

## 2021-01-09 DIAGNOSIS — E559 Vitamin D deficiency, unspecified: Secondary | ICD-10-CM | POA: Diagnosis not present

## 2021-01-09 DIAGNOSIS — R7303 Prediabetes: Secondary | ICD-10-CM | POA: Diagnosis not present

## 2021-01-09 DIAGNOSIS — E782 Mixed hyperlipidemia: Secondary | ICD-10-CM | POA: Diagnosis not present

## 2021-01-16 DIAGNOSIS — E782 Mixed hyperlipidemia: Secondary | ICD-10-CM | POA: Diagnosis not present

## 2021-01-16 DIAGNOSIS — R7303 Prediabetes: Secondary | ICD-10-CM | POA: Insufficient documentation

## 2021-01-16 DIAGNOSIS — H9313 Tinnitus, bilateral: Secondary | ICD-10-CM | POA: Insufficient documentation

## 2021-01-16 DIAGNOSIS — R42 Dizziness and giddiness: Secondary | ICD-10-CM | POA: Insufficient documentation

## 2021-01-16 DIAGNOSIS — E538 Deficiency of other specified B group vitamins: Secondary | ICD-10-CM | POA: Insufficient documentation

## 2021-01-16 DIAGNOSIS — H9203 Otalgia, bilateral: Secondary | ICD-10-CM | POA: Diagnosis not present

## 2021-01-16 DIAGNOSIS — Z0001 Encounter for general adult medical examination with abnormal findings: Secondary | ICD-10-CM | POA: Diagnosis not present

## 2021-01-16 DIAGNOSIS — Z8616 Personal history of COVID-19: Secondary | ICD-10-CM | POA: Diagnosis not present

## 2021-01-16 DIAGNOSIS — R000 Tachycardia, unspecified: Secondary | ICD-10-CM | POA: Diagnosis not present

## 2021-01-16 DIAGNOSIS — E559 Vitamin D deficiency, unspecified: Secondary | ICD-10-CM | POA: Diagnosis not present

## 2021-01-16 DIAGNOSIS — I10 Essential (primary) hypertension: Secondary | ICD-10-CM | POA: Diagnosis not present

## 2021-01-20 DIAGNOSIS — R000 Tachycardia, unspecified: Secondary | ICD-10-CM | POA: Insufficient documentation

## 2021-02-20 ENCOUNTER — Encounter: Payer: Self-pay | Admitting: Cardiology

## 2021-02-20 ENCOUNTER — Encounter: Payer: Self-pay | Admitting: *Deleted

## 2021-02-20 ENCOUNTER — Ambulatory Visit (INDEPENDENT_AMBULATORY_CARE_PROVIDER_SITE_OTHER): Payer: Medicare HMO | Admitting: Cardiology

## 2021-02-20 ENCOUNTER — Ambulatory Visit (INDEPENDENT_AMBULATORY_CARE_PROVIDER_SITE_OTHER): Payer: Medicare HMO

## 2021-02-20 ENCOUNTER — Other Ambulatory Visit: Payer: Self-pay

## 2021-02-20 VITALS — BP 146/90 | HR 107 | Ht 68.0 in | Wt 178.8 lb

## 2021-02-20 DIAGNOSIS — R0609 Other forms of dyspnea: Secondary | ICD-10-CM

## 2021-02-20 DIAGNOSIS — I10 Essential (primary) hypertension: Secondary | ICD-10-CM | POA: Diagnosis not present

## 2021-02-20 DIAGNOSIS — E785 Hyperlipidemia, unspecified: Secondary | ICD-10-CM | POA: Diagnosis not present

## 2021-02-20 DIAGNOSIS — R002 Palpitations: Secondary | ICD-10-CM

## 2021-02-20 NOTE — Patient Instructions (Signed)
°  Testing/Procedures:  Your physician has requested that you have an echocardiogram. Echocardiography is a painless test that uses sound waves to create images of your heart. It provides your doctor with information about the size and shape of your heart and how well your hearts chambers and valves are working. This procedure takes approximately one hour. There are no restrictions for this procedure. [facility]  CORONARY CALCIUM SCORING CT SCAN AT [facility]   Follow-Up: At [facility], you and your health needs are our priority.  As part of our continuing mission to provide you with exceptional heart care, we have created designated Provider Care Teams.  These Care Teams include your primary Cardiologist (physician) and Advanced Practice Providers (APPs -  Physician Assistants and Nurse Practitioners) who all work together to provide you with the care you need, when you need it.  We recommend signing up for the patient portal called "MyChart".  Sign up information is provided on this After Visit Summary.  MyChart is used to connect with patients for Virtual Visits (Telemedicine).  Patients are able to view lab/test results, encounter notes, upcoming appointments, etc.  Non-urgent messages can be sent to your provider as well.   To learn more about what you can do with MyChart, go to [email or web address].    Your next appointment:   3 month(s)  The format for your next appointment:   In Person  Provider:   Oswaldo Milian MD    Other Instructions   TRACK BLOOD PRESSURE 2 X DAILY FOR 1 WEEK AND CALL WITH THE RESULTS.

## 2021-02-20 NOTE — Progress Notes (Signed)
Cardiology Office Note:    Date:  02/20/2021   ID:  Julia [name], DOB March 18, 1950, MRN [id number]  PCP:  Celene Squibb, MD  Cardiologist:  Donato Heinz, MD  Electrophysiologist:  None   Referring MD: Celene Squibb, MD   Chief Complaint  Patient presents with   Palpitations    History of Present Illness:    Julia [name] is a 71 y.o. female with a hx of hypertension, OSA who is referred by Dr. Nevada Crane for evaluation of chest pain/tachycardia.  She previously followed with Dr. Ron Parker in cardiology but has not been seen since 2011-04-16.  Had borderline abnormal nuclear stress test in 16-Apr-2007 and underwent cardiac catheterization which showed no significant CAD.   She reports that she had COVID-19 infection last May.  Reports has not felt the same since that time.  Has been having palpitations and feeling like heart is racing.  Reports heart rate will be up to 120s at rest.  Reports palpitations occurring at least once per week.  Reports chest pain when she eats spicy foods otherwise denies any chest pain.  No exertional chest pain but does report some dyspnea.  Has been having lightheadedness but denies any syncope.  No lower extremity edema.  Reports BP 130s over 70s when checks at home.  No smoking history.  Family history includes father died of MI at age 41, mother died of MI at age 34, and brother died during heart surgery in 2018/04/16 but she is unsure of details.   Past Medical History:  Diagnosis Date   Anemia    Atrial septal aneurysm    Incidental finding, echo, April 16, 2006   Chest pain    Myoview in the past, no definite ischemia,, possible inferior scar   //   cardiac catheterization July 19, 2007,  no significant coronary disease,  EF 55-60%   Ejection fraction    EF 50-55%, echo, February, 2008,  no Jurek motion abnormalities     EF 55-60%, catheterization, 04-16-07   GERD (gastroesophageal reflux disease)    Hypertension    Palpitations    Monitor in 04-16-1998 showed sinus rhythm and sinus  tachycardia. There were occasional supraventricular beats. She was treated with beta blocker    Past Surgical History:  Procedure Laterality Date   KNEE RECONSTRUCTION Right 2006/04/16   TUBAL LIGATION      Current Medications: Current Meds  Medication Sig   benzonatate (TESSALON) 100 MG capsule Take 1 capsule (100 mg total) by mouth every 8 (eight) hours.   Cholecalciferol (VITAMIN D-3) 5000 UNITS TABS Take 1 tablet by mouth daily.   CYANOCOBALAMIN IJ Inject 100 mg as directed once a week. Usually on Wednesday   fexofenadine (ALLEGRA) 180 MG tablet Take 180 mg by mouth daily.   Multiple Vitamin (MULTIVITAMIN WITH MINERALS) TABS Take 1 tablet by mouth daily.   Omega-3 Fatty Acids (FISH OIL PO) Take 2 capsules by mouth 2 (two) times daily.   zinc gluconate 50 MG tablet Take 50 mg by mouth daily.     Allergies:   Elemental sulfur   Social History   Socioeconomic History   Marital status: Married    Spouse name: Not on file   Number of children: Not on file   Years of education: Not on file   Highest education level: Not on file  Occupational History   Not on file  Tobacco Use   Smoking status: Never   Smokeless tobacco: Never  Substance and  Sexual Activity   Alcohol use: No   Drug use: Not on file   Sexual activity: Not on file  Other Topics Concern   Not on file  Social History Narrative   Not on file   Social Determinants of Health   Financial Resource Strain: Not on file  Food Insecurity: Not on file  Transportation Needs: Not on file  Physical Activity: Not on file  Stress: Not on file  Social Connections: Not on file     Family History: The patient's family history includes Heart attack in her father and mother.  ROS:   Please see the history of present illness.     All other systems reviewed and are negative.  EKGs/Labs/Other Studies Reviewed:    The following studies were reviewed today:   EKG:   02/21/20: Sinus tachycardia, rate 107, no ST  abnormality  Recent Labs: No results found for requested labs within last 8760 hours.  Recent Lipid Panel    Component Value Date/Time   CHOL 207 (HH) 08/01/2007 1348   TRIG 126 08/01/2007 1348   HDL 41.2 08/01/2007 1348   CHOLHDL 5.0 CALC 08/01/2007 1348   VLDL 25 08/01/2007 1348   LDLDIRECT 139.4 08/01/2007 1348    Physical Exam:    VS:  BP (!) 146/90    Pulse (!) 107    Ht 5\' 8"  (1.727 m)    Wt 178 lb 12.8 oz (81.1 kg)    SpO2 96%    BMI 27.19 kg/m     Wt Readings from Last 3 Encounters:  02/20/21 178 lb 12.8 oz (81.1 kg)  05/25/12 169 lb 8 oz (76.9 kg)  10/02/11 165 lb 6.4 oz (75 kg)     GEN:  Well nourished, well developed in no acute distress HEENT: Normal NECK: No JVD; No carotid bruits LYMPHATICS: No lymphadenopathy CARDIAC: RRR, no murmurs, rubs, gallops RESPIRATORY:  Clear to auscultation without rales, wheezing or rhonchi  ABDOMEN: Soft, non-tender, non-distended MUSCULOSKELETAL:  No edema; No deformity  SKIN: Warm and dry NEUROLOGIC:  Alert and oriented x 3 PSYCHIATRIC:  Normal affect   ASSESSMENT:    1. Palpitations   2. DOE (dyspnea on exertion)   3. Hyperlipidemia, unspecified hyperlipidemia type   4. Essential hypertension    PLAN:    Palpitations:Description concerning for arrhythmia, evaluate with Zio patch x 7days  Dyspnea: Reports issues with persistent dyspnea, tachycardia, and palpitations since COVID-19 infection last year.  Check echocardiogram to evaluate for myocardial involvement  Hypertension: Previously on the but currently not on any antihypertensive. BP elevated in clinic today.  Asked to check BP twice daily for next week and call with results  Hyperlipidemia: LDL 159 on 01/09/2021.  Has refused statin.  Check calcium score to guide how aggressive to be in lowering cholesterol.  OSA: Not on CPAP but uses dental appliance  RTC in 3 months   Medication Adjustments/Labs and Tests Ordered: Current medicines are reviewed at length  with the patient today.  Concerns regarding medicines are outlined above.  Orders Placed This Encounter  Procedures   CT CARDIAC SCORING (SELF PAY ONLY)   LONG TERM MONITOR (3-14 DAYS)   EKG 12-Lead   ECHOCARDIOGRAM COMPLETE   No orders of the defined types were placed in this encounter.   Patient Instructions   Testing/Procedures:  Your physician has requested that you have an echocardiogram. Echocardiography is a painless test that uses sound waves to create images of your heart. It provides your doctor with information  about the size and shape of your heart and how well your hearts chambers and valves are working. This procedure takes approximately one hour. There are no restrictions for this procedure. [facility]  CORONARY CALCIUM SCORING CT SCAN AT [facility]   Follow-Up: At [facility], you and your health needs are our priority.  As part of our continuing mission to provide you with exceptional heart care, we have created designated Provider Care Teams.  These Care Teams include your primary Cardiologist (physician) and Advanced Practice Providers (APPs -  Physician Assistants and Nurse Practitioners) who all work together to provide you with the care you need, when you need it.  We recommend signing up for the patient portal called "MyChart".  Sign up information is provided on this After Visit Summary.  MyChart is used to connect with patients for Virtual Visits (Telemedicine).  Patients are able to view lab/test results, encounter notes, upcoming appointments, etc.  Non-urgent messages can be sent to your provider as well.   To learn more about what you can do with MyChart, go to [email or web address].    Your next appointment:   3 month(s)  The format for your next appointment:   In Person  Provider:   Oswaldo Milian MD    Other Instructions   TRACK BLOOD PRESSURE 2 X DAILY FOR 1 WEEK AND CALL WITH THE RESULTS.   Signed, Donato Heinz, MD  02/20/2021 5:52 PM    [place]

## 2021-02-20 NOTE — Progress Notes (Unsigned)
Enrolled for [employer] to mail a ZIO XT long term holter monitor to the patients address on file.  Letter with instructions mailed to patient. 

## 2021-02-26 DIAGNOSIS — R002 Palpitations: Secondary | ICD-10-CM

## 2021-03-03 DIAGNOSIS — L570 Actinic keratosis: Secondary | ICD-10-CM | POA: Diagnosis not present

## 2021-03-03 DIAGNOSIS — L609 Nail disorder, unspecified: Secondary | ICD-10-CM | POA: Diagnosis not present

## 2021-03-04 ENCOUNTER — Telehealth: Payer: Self-pay | Admitting: Cardiology

## 2021-03-04 NOTE — Telephone Encounter (Signed)
Patient was calling back with her bp and hr for last week  2/20: 133/82 HR:107 am          132/84 HR 109 pm  2/21: 143/89 HR 113 am (after virgo meds)          131/79 HR 109 pm  2/22: 128/88 HR 99 am          125/83 HR 109 pm  2/23: 139/86 HR 111 am          133/84 HR 95 pm  2/24: 134/79 HR 113 am          133/82 HR 98 pm  2/25: 136/88 HR 109 am          128/77 HR 103 pm  2/26: 131/84 HR 90 am          119/74 HR 101 pm

## 2021-03-05 NOTE — Telephone Encounter (Signed)
BP looks okay, can hold off on medication at this time, would limit salt intake ?

## 2021-03-07 NOTE — Telephone Encounter (Signed)
Patient aware of recommendations and verbalized understanding

## 2021-03-18 ENCOUNTER — Ambulatory Visit (HOSPITAL_COMMUNITY)
Admission: RE | Admit: 2021-03-18 | Discharge: 2021-03-18 | Disposition: A | Discharge status: Home / Self Care | Payer: Medicare HMO | Source: Ambulatory Visit | Attending: Cardiology | Admitting: Cardiology

## 2021-03-18 DIAGNOSIS — Z8616 Personal history of COVID-19: Secondary | ICD-10-CM | POA: Diagnosis not present

## 2021-03-18 DIAGNOSIS — I340 Nonrheumatic mitral (valve) insufficiency: Secondary | ICD-10-CM | POA: Insufficient documentation

## 2021-03-18 DIAGNOSIS — I10 Essential (primary) hypertension: Secondary | ICD-10-CM | POA: Diagnosis not present

## 2021-03-18 DIAGNOSIS — I3139 Other pericardial effusion (noninflammatory): Secondary | ICD-10-CM | POA: Insufficient documentation

## 2021-03-18 DIAGNOSIS — E785 Hyperlipidemia, unspecified: Secondary | ICD-10-CM | POA: Insufficient documentation

## 2021-03-18 DIAGNOSIS — R002 Palpitations: Secondary | ICD-10-CM | POA: Diagnosis not present

## 2021-03-18 DIAGNOSIS — I119 Hypertensive heart disease without heart failure: Secondary | ICD-10-CM | POA: Insufficient documentation

## 2021-03-18 LAB — ECHOCARDIOGRAM COMPLETE
Area-P 1/2: 6.71 cm2
S' Lateral: 2.8 cm

## 2021-03-18 NOTE — Progress Notes (Signed)
*  PRELIMINARY RESULTS* ?Echocardiogram ?2D Echocardiogram has been performed. ? ?Julia [name] ?03/18/2021, 9:19 AM ?

## 2021-03-19 ENCOUNTER — Encounter: Payer: Self-pay | Admitting: Cardiology

## 2021-03-30 NOTE — Telephone Encounter (Signed)
See result note.  

## 2021-04-04 ENCOUNTER — Other Ambulatory Visit: Payer: Self-pay | Admitting: *Deleted

## 2021-04-04 ENCOUNTER — Telehealth: Payer: Self-pay | Admitting: Cardiology

## 2021-04-04 ENCOUNTER — Ambulatory Visit (HOSPITAL_COMMUNITY)
Admission: RE | Admit: 2021-04-04 | Discharge: 2021-04-04 | Disposition: A | Discharge status: Home / Self Care | Payer: Self-pay | Source: Ambulatory Visit | Attending: Cardiology | Admitting: Cardiology

## 2021-04-04 DIAGNOSIS — E785 Hyperlipidemia, unspecified: Secondary | ICD-10-CM | POA: Insufficient documentation

## 2021-04-04 DIAGNOSIS — R911 Solitary pulmonary nodule: Secondary | ICD-10-CM

## 2021-04-04 NOTE — Telephone Encounter (Signed)
[facility] calling with a call report  ?

## 2021-04-04 NOTE — Telephone Encounter (Signed)
Received call from [facility] from [facility] radiology with report of patient's CT scoring procedure. Please review results. Patient has a large pulmonary nodule and needs a thoracic surgery consult. ?

## 2021-04-08 DIAGNOSIS — Z1231 Encounter for screening mammogram for malignant neoplasm of breast: Secondary | ICD-10-CM | POA: Diagnosis not present

## 2021-04-08 NOTE — Telephone Encounter (Signed)
I spoke with patient on 3/31 and referral to thoracic surgery was placed ?

## 2021-04-09 ENCOUNTER — Encounter: Payer: Self-pay | Admitting: Cardiology

## 2021-04-21 DIAGNOSIS — L570 Actinic keratosis: Secondary | ICD-10-CM | POA: Diagnosis not present

## 2021-05-06 ENCOUNTER — Encounter: Payer: Medicare HMO | Admitting: Thoracic Surgery (Cardiothoracic Vascular Surgery)

## 2021-05-14 DIAGNOSIS — H903 Sensorineural hearing loss, bilateral: Secondary | ICD-10-CM | POA: Insufficient documentation

## 2021-05-14 DIAGNOSIS — H9313 Tinnitus, bilateral: Secondary | ICD-10-CM | POA: Diagnosis not present

## 2021-05-20 ENCOUNTER — Encounter: Payer: Self-pay | Admitting: Thoracic Surgery (Cardiothoracic Vascular Surgery)

## 2021-05-20 ENCOUNTER — Institutional Professional Consult (permissible substitution): Payer: Medicare HMO | Admitting: Thoracic Surgery (Cardiothoracic Vascular Surgery)

## 2021-05-20 ENCOUNTER — Other Ambulatory Visit: Payer: Self-pay | Admitting: Thoracic Surgery (Cardiothoracic Vascular Surgery)

## 2021-05-20 VITALS — BP 159/96 | HR 115 | Resp 20 | Ht 68.0 in | Wt 176.0 lb

## 2021-05-20 DIAGNOSIS — R911 Solitary pulmonary nodule: Secondary | ICD-10-CM

## 2021-05-20 DIAGNOSIS — N951 Menopausal and female climacteric states: Secondary | ICD-10-CM | POA: Insufficient documentation

## 2021-05-20 DIAGNOSIS — N390 Urinary tract infection, site not specified: Secondary | ICD-10-CM | POA: Insufficient documentation

## 2021-05-20 DIAGNOSIS — F32A Depression, unspecified: Secondary | ICD-10-CM | POA: Insufficient documentation

## 2021-05-20 NOTE — Progress Notes (Signed)
PCP is Celene Squibb, MD ?Referring Provider is Donato Heinz* ? ?Chief Complaint  ?Patient presents with  ? Lung Lesion  ?  Surgical consult, Cardiac CT 04/04/21  ? ? ?HPI: Julia [name] sent for consultation regarding a left upper lobe lung nodule. ? ?Julia [name] is a 71 year old woman with a past history significant for hypertension, reflux, palpitations, atrial septal aneurysm, anemia, lung nodule and COVID-19.  She had COVID-19 about a year ago, and says she is never felt the same since.  She has been having frequent palpitations.  She saw Dr. Gardiner Rhyme.  As part of her evaluation she had a cardiac CT for coronary calcium scoring.  She was noted to have a 2.3 x 2.8 cm well-circumscribed nodule in the lingula increased in size from 14 mm on CT of the chest in 2016. ? ?She had had the nodule evaluated back in 2016.  She saw Dr. Waldemar Dickens at [facility].  A follow-up scan a year later showed no significant change.  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 ? ?She is a lifelong non-smoker.  She has palpitations more frequently.  Some lightheadedness but no syncope.  Occasional shortness of breath. ? ?Past Medical History:  ?Diagnosis Date  ? Anemia   ? Atrial septal aneurysm   ? Incidental finding, echo, 2008  ? Chest pain   ? Myoview in the past, no definite ischemia,, possible inferior scar   //   cardiac catheterization July 19, 2007,  no significant coronary disease,  EF 55-60%  ? Ejection fraction   ? EF 50-55%, echo, February, 2008,  no Zaman motion abnormalities     EF 55-60%, catheterization, 2009  ? GERD (gastroesophageal reflux disease)   ? Hypertension   ? Palpitations   ? Monitor in 2000 showed sinus rhythm and sinus tachycardia. There were occasional supraventricular beats. She was treated with beta blocker  ? ? ?Past Surgical History:  ?Procedure Laterality Date  ? KNEE RECONSTRUCTION Right 2008  ? TUBAL LIGATION    ? ? ?Family History  ?Problem Relation Age of Onset  ? Heart  attack Mother   ? Heart attack Father   ? ? ?Social History ?Social History  ? ?Tobacco Use  ? Smoking status: Never  ? Smokeless tobacco: Never  ?Substance Use Topics  ? Alcohol use: No  ? ? ?Current Outpatient Medications  ?Medication Sig Dispense Refill  ? Cholecalciferol (VITAMIN D-3) 5000 UNITS TABS Take 1 tablet by mouth daily.    ? CYANOCOBALAMIN IJ Inject 100 mg as directed once a week. Usually on Wednesday    ? fexofenadine (ALLEGRA) 180 MG tablet Take 180 mg by mouth daily.    ? Multiple Vitamin (MULTIVITAMIN WITH MINERALS) TABS Take 1 tablet by mouth daily.    ? Omega-3 Fatty Acids (FISH OIL PO) Take 2 capsules by mouth 2 (two) times daily.    ? zinc gluconate 50 MG tablet Take 50 mg by mouth daily.    ? benzonatate (TESSALON) 100 MG capsule Take 1 capsule (100 mg total) by mouth every 8 (eight) hours. (Patient not taking: Reported on 05/20/2021) 21 capsule 0  ? ?No current facility-administered medications for this visit.  ? ? ?Allergies  ?Allergen Reactions  ? Elemental Sulfur Hives  ? Sulfa Antibiotics   ? ? ?Review of Systems  ?Constitutional:  Negative for chills and fever.  ?HENT:  Positive for hearing loss.   ?Respiratory:  Positive for apnea and shortness of breath.   ?  Cardiovascular:  Positive for palpitations. Negative for chest pain.  ?Genitourinary:  Negative for difficulty urinating and dysuria.  ?Musculoskeletal:   ?     Leg cramps  ?Neurological:  Negative for seizures and syncope.  ?Hematological:  Negative for adenopathy. Does not bruise/bleed easily.  ?Psychiatric/Behavioral:    ?     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 ?BP (!) 159/96   Pulse (!) 115   Resp 20   Ht '5\' 8"'$  (1.727 m)   Wt 176 lb (79.8 kg)   SpO2 98%   BMI 26.76 kg/m?  ?Physical Exam ?Vitals reviewed.  ?Constitutional:   ?   General: She is not in acute distress. ?   Appearance: Normal appearance.  ?HENT:  ?   Head: Normocephalic and atraumatic.  ?Eyes:  ?   General: No scleral  icterus. ?   Extraocular Movements: Extraocular movements intact.  ?Neck:  ?   Vascular: No carotid bruit.  ?Cardiovascular:  ?   Rate and Rhythm: Tachycardia present. Rhythm irregular.  ?   Pulses: Normal pulses.  ?   Heart sounds: No murmur heard. ?  No friction rub. No gallop.  ?Pulmonary:  ?   Effort: Pulmonary effort is normal. No respiratory distress.  ?   Breath sounds: Normal breath sounds. No wheezing or rales.  ?Abdominal:  ?   General: There is no distension.  ?   Palpations: Abdomen is soft.  ?Lymphadenopathy:  ?   Cervical: No cervical adenopathy.  ?Skin: ?   General: Skin is warm and dry.  ?Neurological:  ?   General: No focal deficit present.  ?   Mental Status: She is oriented to person, place, and time.  ?   Cranial Nerves: No cranial nerve deficit.  ?   Motor: No weakness.  ? ? ? ?Diagnostic Tests: ?OVER-READ INTERPRETATION  CT CHEST ?  ?The following report is an over-read performed by radiologist Dr. ?[facility], [place] on 04/04/2021. This ?over-read does not include interpretation of cardiac or coronary ?anatomy or pathology. The calcium score interpretation by the ?cardiologist is attached. ?  ?COMPARISON:  CT 06/08/2014 ?  ?FINDINGS: ?Limited view of the lung parenchyma demonstrates rounded nodule in ?the lingula measuring 2.3 x 2.8 cm. This is increased in size from ?1.4 x 1.0 cm on CT 06/08/2014. Airways are normal. ?  ?Limited view of the mediastinum demonstrates no adenopathy. A fat ?herniates into the middle mediastinum adjacent to the esophagus ?(image 29/2. Esophagus normal. ?  ?Limited view of the upper abdomen unremarkable. ?  ?Limited view of the skeleton and chest Mergen is unremarkable. ?  ?IMPRESSION: ?1. Large pulmonary nodule within lingula. Nodule increased ?significantly in size from 2016. Recommend thoracic surgery ?consultation for enlarging pulmonary nodule. FDG PET scan may add ?information as to malignant potential. ?2. Fat herniation into the middle  mediastinum adjacent to esophagus. ?  ?These results will be called to the ordering clinician or ?representative by the Radiologist Assistant, and communication ?documented in the PACS or [employer]. ?  ?  ?Electronically Signed ?  By: Suzy Bouchard M.D. ?  On: 04/04/2021 10:40 ? ?I personally reviewed the CT images.  There is a large nodule in the lingula increased in diameter from 14 mm in 2016 2.3 x 2.8 cm presently.  Smoothly marginated.  No adenopathy on imaging.  Incomplete imaging of lungs. ? ?Impression: ?Julia [name] is a 71 year old woman with a past history significant  for hypertension, reflux, palpitations, atrial septal aneurysm, anemia, lung nodule and COVID-19.  She presented with palpitations and was found to have a lung nodule on a CT done for coronary calcium screening. ? ?Left upper lobe lung nodule-nodule that has increased in size from 14 mm in 2016 to about 28 mm today.  It remains smoothly marginated.  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  Differential diagnosis includes hamartoma and carcinoid tumors.   ? ?We discussed the options of radiographic observation, biopsy, and surgical resection.  We reviewed the advantages and disadvantages of each of those approaches. ? ?I think the best option would be to do a PET/CT to guide our initial diagnostic work-up. ? ?She will also need pulmonary function testing with and without bronchodilators.  She is a lifelong non-smoker so not terribly concerned but just want to make sure she will be able to tolerate resection should it be necessary. ? ?Palpitations-definitely tachycardic on exam today.  Some variability to rate.  Has a follow-up with Dr. Gardiner Rhyme later this week. ? ?Plan: ?PET/CT to further evaluate enlarging lung nodule and guide initial diagnostic work-up ?Pulmonary function testing with and without bronchodilators ?Follow-up with Dr. Gardiner Rhyme ?Return in 3 weeks to discuss results. ? ?I spent over 30 minutes in review of records,  images, and in consultation with Mrs. Sefcik today. ?Melrose Nakayama, MD ?[facility] ?([phone] ? ?

## 2021-05-29 ENCOUNTER — Ambulatory Visit (HOSPITAL_COMMUNITY)
Admission: RE | Admit: 2021-05-29 | Discharge: 2021-05-29 | Disposition: A | Discharge status: Home / Self Care | Payer: Medicare HMO | Source: Ambulatory Visit | Attending: Thoracic Surgery (Cardiothoracic Vascular Surgery) | Admitting: Thoracic Surgery (Cardiothoracic Vascular Surgery)

## 2021-05-29 DIAGNOSIS — I2510 Atherosclerotic heart disease of native coronary artery without angina pectoris: Secondary | ICD-10-CM | POA: Diagnosis not present

## 2021-05-29 DIAGNOSIS — R911 Solitary pulmonary nodule: Secondary | ICD-10-CM | POA: Diagnosis not present

## 2021-05-29 DIAGNOSIS — K5730 Diverticulosis of large intestine without perforation or abscess without bleeding: Secondary | ICD-10-CM | POA: Diagnosis not present

## 2021-05-29 DIAGNOSIS — K449 Diaphragmatic hernia without obstruction or gangrene: Secondary | ICD-10-CM | POA: Diagnosis not present

## 2021-05-29 DIAGNOSIS — J341 Cyst and mucocele of nose and nasal sinus: Secondary | ICD-10-CM | POA: Diagnosis not present

## 2021-05-29 MED ORDER — FLUDEOXYGLUCOSE F - 18 (FDG) INJECTION
9.2500 | Freq: Once | INTRAVENOUS | Status: AC | PRN
  Administered 2021-05-29: 9.25 via INTRAVENOUS

## 2021-05-30 ENCOUNTER — Encounter: Payer: Self-pay | Admitting: Cardiology

## 2021-05-30 ENCOUNTER — Ambulatory Visit: Payer: Medicare HMO | Admitting: Cardiology

## 2021-05-30 VITALS — BP 165/97 | HR 96 | Ht 68.0 in | Wt 174.4 lb

## 2021-05-30 DIAGNOSIS — E785 Hyperlipidemia, unspecified: Secondary | ICD-10-CM | POA: Diagnosis not present

## 2021-05-30 DIAGNOSIS — I10 Essential (primary) hypertension: Secondary | ICD-10-CM | POA: Diagnosis not present

## 2021-05-30 DIAGNOSIS — R911 Solitary pulmonary nodule: Secondary | ICD-10-CM

## 2021-05-30 DIAGNOSIS — R002 Palpitations: Secondary | ICD-10-CM | POA: Diagnosis not present

## 2021-05-30 DIAGNOSIS — R0602 Shortness of breath: Secondary | ICD-10-CM | POA: Diagnosis not present

## 2021-05-30 MED ORDER — CARVEDILOL 6.25 MG PO TABS: 6.2500 mg | ORAL_TABLET | Freq: Two times a day (BID) | ORAL | 3 refills | Status: DC

## 2021-05-30 NOTE — Patient Instructions (Signed)
Medication Instructions:  START carvedilol (Coreg) 6.25 mg two times daily  Please check your blood pressure at home daily, write it down.  Call the office or send message via Mychart with the readings in 2 weeks for Dr. Gardiner Rhyme to review.   *If you need a refill on your cardiac medications before your next appointment, please call your pharmacy*  Testing/Procedures: CARDIAC PET- Your physician has requested that you have a Cardiac Pet Stress Test. This testing is completed at [facility] ([place], [place] [address]). The schedulers will call you to get this scheduled. Please follow instructions below and call the office with any questions/concerns [phone]).  Follow-Up: At [facility], you and your health needs are our priority.  As part of our continuing mission to provide you with exceptional heart care, we have created designated Provider Care Teams.  These Care Teams include your primary Cardiologist (physician) and Advanced Practice Providers (APPs -  Physician Assistants and Nurse Practitioners) who all work together to provide you with the care you need, when you need it.  We recommend signing up for the patient portal called "MyChart".  Sign up information is provided on this After Visit Summary.  MyChart is used to connect with patients for Virtual Visits (Telemedicine).  Patients are able to view lab/test results, encounter notes, upcoming appointments, etc.  Non-urgent messages can be sent to your provider as well.   To learn more about what you can do with MyChart, go to [email or web address].    Your next appointment:   6 month(s)  The format for your next appointment:   In Person  Provider:   Donato Heinz, MD {   Other Instructions How to Prepare for Your Cardiac PET/CT Stress Test:  1. Please do not take these medications before your test:   Medications that may interfere with the cardiac pharmacological stress  agent (ex. nitrates or beta-blockers) the day of the exam. Theophylline containing medications for 12 hours. Dipyridamole 48 hours prior to the test. Your remaining medications may be taken with water.  2. Nothing to eat or drink, except water, 3 hours prior to arrival time.   NO caffeine/decaffeinated products, or chocolate 12 hours prior to arrival.  3. NO perfume, cologne or lotion  4. Total time is 1 to 2 hours; you may want to bring reading material for the waiting time.  5. Please report to Admitting at the [facility] Main Entrance 60 minutes early for your test.  [place], [place] [address]  Diabetic Preparation:  Hold oral medications. You may take NPH and Lantus insulin. Do not take Humalog or Humulin R (Regular Insulin) the day of your test. Check blood sugars prior to leaving the house. If able to eat breakfast prior to 3 hour fasting, you may take all medications, including your insulin, Do not worry if you miss your breakfast dose of insulin - start at your next meal.  IF YOU THINK YOU MAY BE PREGNANT, OR ARE NURSING PLEASE INFORM THE TECHNOLOGIST.  In preparation for your appointment, medication and supplies will be purchased.  Appointment availability is limited, so if you need to cancel or reschedule, please call the Radiology Department at [phone]  24 hours in advance to avoid a cancellation fee of $100.00  What to Expect After you Arrive:  Once you arrive and check in for your appointment, you will be taken to a preparation room within the Radiology Department.  A technologist  or Nurse will obtain your medical history, verify that you are correctly prepped for the exam, and explain the procedure.  Afterwards,  an IV will be started in your arm and electrodes will be placed on your skin for EKG monitoring during the stress portion of the exam. Then you will be escorted to the PET/CT scanner.  There, staff will get you positioned on the  scanner and obtain a blood pressure and EKG.  During the exam, you will continue to be connected to the EKG and blood pressure machines.  A small, safe amount of a radioactive tracer will be injected in your IV to obtain a series of pictures of your heart along with an injection of a stress agent.    After your Exam:  It is recommended that you eat a meal and drink a caffeinated beverage to counter act any effects of the stress agent.  Drink plenty of fluids for the remainder of the day and urinate frequently for the first couple of hours after the exam.  Your doctor will inform you of your test results within 7-10 business days.  For questions about your test or how to prepare for your test, please call: Marchia Bond, Cardiac Imaging Nurse Navigator  Gordy Clement, Cardiac Imaging Nurse Navigator Office: [phone]

## 2021-05-30 NOTE — Progress Notes (Signed)
Cardiology Office Note:    Date:  05/30/2021   ID:  Julia [name], DOB 1950-05-31, MRN [id number]  PCP:  Celene Squibb, MD  Cardiologist:  Donato Heinz, MD  Electrophysiologist:  None   Referring MD: Celene Squibb, MD   Chief Complaint  Patient presents with   Palpitations    History of Present Illness:    Julia [name] is a 71 y.o. female with a hx of hypertension, OSA who presents for follow-up.  She was referred by Dr. Nevada Crane for evaluation of chest pain/tachycardia, initially seen on 02/20/2021.  She previously followed with Dr. Ron Parker in cardiology but has not been seen since 04-12-2011.  Had borderline abnormal nuclear stress test in Apr 12, 2007 and underwent cardiac catheterization which showed no significant CAD.   She reports that she had COVID-19 infection last May.  Reports has not felt the same since that time.  Has been having palpitations and feeling like heart is racing.  Reports heart rate will be up to 120s at rest.  Reports palpitations occurring at least once per week.  Reports chest pain when she eats spicy foods otherwise denies any chest pain.  No exertional chest pain but does report some dyspnea.  Has been having lightheadedness but denies any syncope.  No lower extremity edema.  Reports BP 130s over 70s when checks at home.  No smoking history.  Family history includes father died of MI at age 72, mother died of MI at age 39, and brother died during heart surgery in 04/12/2018 but she is unsure of details.  Calcium score 33 (61st percentile) on 04/04/2021.  Also showed large lung nodule, increased in size from April 12, 2014 for which she was referred to thoracic surgery.  Echocardiogram 03/18/2021 showed normal biventricular function, no significant valvular disease.  Zio patch x14 days showed 1 episode of NSVT lasting 10 beats, 6 episodes of SVT with longest lasting 13 beats, occasional PVCs (1.4% of beats).  Since last clinic visit, she reports has been doing okay.  Continues to have intermittent  palpitations.  Continues to have shortness of breath, reports has particularly noticed it when walking up stairs.  Denies any syncope, lower extremity edema.   Past Medical History:  Diagnosis Date   Anemia    Atrial septal aneurysm    Incidental finding, echo, 04/12/06   Chest pain    Myoview in the past, no definite ischemia,, possible inferior scar   //   cardiac catheterization July 19, 2007,  no significant coronary disease,  EF 55-60%   Ejection fraction    EF 50-55%, echo, February, 2008,  no Decelles motion abnormalities     EF 55-60%, catheterization, 12-Apr-2007   GERD (gastroesophageal reflux disease)    Hypertension    Palpitations    Monitor in 1998/04/12 showed sinus rhythm and sinus tachycardia. There were occasional supraventricular beats. She was treated with beta blocker    Past Surgical History:  Procedure Laterality Date   KNEE RECONSTRUCTION Right Apr 12, 2006   TUBAL LIGATION      Current Medications: Current Meds  Medication Sig   Biotin 5000 MCG TABS Take by mouth. Take 1 tablet by mouth daily   carvedilol (COREG) 6.25 MG tablet Take 1 tablet (6.25 mg total) by mouth 2 (two) times daily.   Cholecalciferol (VITAMIN D-3) 5000 UNITS TABS Take 1 tablet by mouth daily.   CYANOCOBALAMIN IJ Inject 100 mg as directed once a week. Usually on Wednesday   fexofenadine (ALLEGRA) 180 MG  tablet Take 180 mg by mouth daily.   meclizine (ANTIVERT) 25 MG tablet Take 25 mg by mouth 3 (three) times daily as needed for dizziness. Take 1 tablet every 6 hours as needed for dizziness.   Multiple Vitamin (MULTIVITAMIN WITH MINERALS) TABS Take 1 tablet by mouth daily.     Allergies:   Elemental sulfur and Sulfa antibiotics   Social History   Socioeconomic History   Marital status: Married    Spouse name: Not on file   Number of children: Not on file   Years of education: Not on file   Highest education level: Not on file  Occupational History   Not on file  Tobacco Use   Smoking status: Never    Smokeless tobacco: Never  Substance and Sexual Activity   Alcohol use: No   Drug use: Not on file   Sexual activity: Not on file  Other Topics Concern   Not on file  Social History Narrative   Not on file   Social Determinants of Health   Financial Resource Strain: Not on file  Food Insecurity: Not on file  Transportation Needs: Not on file  Physical Activity: Not on file  Stress: Not on file  Social Connections: Not on file     Family History: The patient's family history includes Heart attack in her father and mother.  ROS:   Please see the history of present illness.     All other systems reviewed and are negative.  EKGs/Labs/Other Studies Reviewed:    The following studies were reviewed today:   EKG:   05/30/21: Normal sinus rhythm, rate 96, no ST abnormalities 02/21/20: Sinus tachycardia, rate 107, no ST abnormality  Recent Labs: No results found for requested labs within last 8760 hours.  Recent Lipid Panel    Component Value Date/Time   CHOL 207 (HH) 08/01/2007 1348   TRIG 126 08/01/2007 1348   HDL 41.2 08/01/2007 1348   CHOLHDL 5.0 CALC 08/01/2007 1348   VLDL 25 08/01/2007 1348   LDLDIRECT 139.4 08/01/2007 1348    Physical Exam:    VS:  BP (!) 165/97   Pulse 96   Ht '5\' 8"'$  (1.727 m)   Wt 174 lb 6.4 oz (79.1 kg)   SpO2 98%   BMI 26.52 kg/m     Wt Readings from Last 3 Encounters:  05/30/21 174 lb 6.4 oz (79.1 kg)  05/20/21 176 lb (79.8 kg)  02/20/21 178 lb 12.8 oz (81.1 kg)     GEN:  Well nourished, well developed in no acute distress HEENT: Normal NECK: No JVD; No carotid bruits CARDIAC: RRR, no murmurs, rubs, gallops RESPIRATORY:  Clear to auscultation without rales, wheezing or rhonchi  ABDOMEN: Soft, non-tender, non-distended MUSCULOSKELETAL:  No edema; No deformity  SKIN: Warm and dry NEUROLOGIC:  Alert and oriented x 3 PSYCHIATRIC:  Normal affect   ASSESSMENT:    1. Shortness of breath   2. Palpitations   3. Pulmonary nodule    4. Essential hypertension   5. Hyperlipidemia, unspecified hyperlipidemia type     PLAN:    Palpitations Zio patch x14 days showed 1 episode of NSVT lasting 10 beats, 6 episodes of SVT with longest lasting 13 beats, occasional PVCs (1.4% of beats). -She reports she continues to have palpitations, could be related to brief episodes of SVT/NSVT.  Start carvedilol 6.25 mg twice daily  Dyspnea: Reports having dyspnea with exertion, particular when walking up stairs.  Does have significant CAD risk factors (age, hypertension,  hyperlipidemia).  Could represent anginal equivalent.  Echocardiogram 03/18/2021 showed normal biventricular function, no significant valvular disease.  -Not a good candidate for coronary CTA given her persistently elevated heart rate.  Recommend stress PET for further evaluation  Hypertension: Currently not on any antihypertensive. BP has been elevated, will start carvedilol 6.25 mg twice daily as above.  Asked to check BP daily for next 2-week and call with results  Hyperlipidemia: LDL 159 on 01/09/2021.  Has refused statin.  Calcium score 33 (61st percentile) on 04/04/2021 -Discussed starting statin or other cholesterol medication, but she declines any medications for her cholesterol  OSA: Not on CPAP but uses dental appliance  Lung nodule: Large lung nodule noted within the lingula on calcium score 04/04/2021, increased in size from 2016.  Referred to Dr. Roxan Hockey in thoracic surgery, planning PET scan   RTC in 6 months  Shared Decision Making/Informed Consent The risks [chest pain, shortness of breath, cardiac arrhythmias, dizziness, blood pressure fluctuations, myocardial infarction, stroke/transient ischemic attack, nausea, vomiting, allergic reaction, radiation exposure, metallic taste sensation and life-threatening complications (estimated to be 1 in 10,000)], benefits (risk stratification, diagnosing coronary artery disease, treatment guidance) and alternatives of  a cardiac PET stress test were discussed in detail with Ms. Suttles and she agrees to proceed.   Medication Adjustments/Labs and Tests Ordered: Current medicines are reviewed at length with the patient today.  Concerns regarding medicines are outlined above.  Orders Placed This Encounter  Procedures   NM PET CT CARDIAC PERFUSION MULTI W/ABSOLUTE BLOODFLOW   EKG 12-Lead   Meds ordered this encounter  Medications   carvedilol (COREG) 6.25 MG tablet    Sig: Take 1 tablet (6.25 mg total) by mouth 2 (two) times daily.    Dispense:  180 tablet    Refill:  3    Patient Instructions  Medication Instructions:  START carvedilol (Coreg) 6.25 mg two times daily  Please check your blood pressure at home daily, write it down.  Call the office or send message via Mychart with the readings in 2 weeks for Dr. Gardiner Rhyme to review.   *If you need a refill on your cardiac medications before your next appointment, please call your pharmacy*  Testing/Procedures: CARDIAC PET- Your physician has requested that you have a Cardiac Pet Stress Test. This testing is completed at [facility] ([place], [place] [address]). The schedulers will call you to get this scheduled. Please follow instructions below and call the office with any questions/concerns [phone]).  Follow-Up: At [facility], you and your health needs are our priority.  As part of our continuing mission to provide you with exceptional heart care, we have created designated Provider Care Teams.  These Care Teams include your primary Cardiologist (physician) and Advanced Practice Providers (APPs -  Physician Assistants and Nurse Practitioners) who all work together to provide you with the care you need, when you need it.  We recommend signing up for the patient portal called "MyChart".  Sign up information is provided on this After Visit Summary.  MyChart is used to connect with patients for Virtual Visits  (Telemedicine).  Patients are able to view lab/test results, encounter notes, upcoming appointments, etc.  Non-urgent messages can be sent to your provider as well.   To learn more about what you can do with MyChart, go to [email or web address].    Your next appointment:   6 month(s)  The format for your next appointment:   In Person  Provider:  Donato Heinz, MD {   Other Instructions How to Prepare for Your Cardiac PET/CT Stress Test:  1. Please do not take these medications before your test:   Medications that may interfere with the cardiac pharmacological stress agent (ex. nitrates or beta-blockers) the day of the exam. Theophylline containing medications for 12 hours. Dipyridamole 48 hours prior to the test. Your remaining medications may be taken with water.  2. Nothing to eat or drink, except water, 3 hours prior to arrival time.   NO caffeine/decaffeinated products, or chocolate 12 hours prior to arrival.  3. NO perfume, cologne or lotion  4. Total time is 1 to 2 hours; you may want to bring reading material for the waiting time.  5. Please report to Admitting at the [facility] Main Entrance 60 minutes early for your test.  [place], [place] [id number]  Diabetic Preparation:  Hold oral medications. You may take NPH and Lantus insulin. Do not take Humalog or Humulin R (Regular Insulin) the day of your test. Check blood sugars prior to leaving the house. If able to eat breakfast prior to 3 hour fasting, you may take all medications, including your insulin, Do not worry if you miss your breakfast dose of insulin - start at your next meal.  IF YOU THINK YOU MAY BE PREGNANT, OR ARE NURSING PLEASE INFORM THE TECHNOLOGIST.  In preparation for your appointment, medication and supplies will be purchased.  Appointment availability is limited, so if you need to cancel or reschedule, please call the Radiology Department at [phone]  24  hours in advance to avoid a cancellation fee of $100.00  What to Expect After you Arrive:  Once you arrive and check in for your appointment, you will be taken to a preparation room within the Radiology Department.  A technologist or Nurse will obtain your medical history, verify that you are correctly prepped for the exam, and explain the procedure.  Afterwards,  an IV will be started in your arm and electrodes will be placed on your skin for EKG monitoring during the stress portion of the exam. Then you will be escorted to the PET/CT scanner.  There, staff will get you positioned on the scanner and obtain a blood pressure and EKG.  During the exam, you will continue to be connected to the EKG and blood pressure machines.  A small, safe amount of a radioactive tracer will be injected in your IV to obtain a series of pictures of your heart along with an injection of a stress agent.    After your Exam:  It is recommended that you eat a meal and drink a caffeinated beverage to counter act any effects of the stress agent.  Drink plenty of fluids for the remainder of the day and urinate frequently for the first couple of hours after the exam.  Your doctor will inform you of your test results within 7-10 business days.  For questions about your test or how to prepare for your test, please call: Marchia Bond, Cardiac Imaging Nurse Navigator  Gordy Clement, Cardiac Imaging Nurse Navigator Office: [phone]         Signed, Donato Heinz, MD  05/30/2021 10:42 AM    [place]

## 2021-06-09 DIAGNOSIS — Z01419 Encounter for gynecological examination (general) (routine) without abnormal findings: Secondary | ICD-10-CM | POA: Diagnosis not present

## 2021-06-09 DIAGNOSIS — Z6825 Body mass index (BMI) 25.0-25.9, adult: Secondary | ICD-10-CM | POA: Diagnosis not present

## 2021-06-17 ENCOUNTER — Encounter: Payer: Self-pay | Admitting: Thoracic Surgery (Cardiothoracic Vascular Surgery)

## 2021-06-17 ENCOUNTER — Ambulatory Visit: Payer: Medicare HMO | Admitting: Thoracic Surgery (Cardiothoracic Vascular Surgery)

## 2021-06-17 ENCOUNTER — Ambulatory Visit (HOSPITAL_COMMUNITY)
Admission: RE | Admit: 2021-06-17 | Discharge: 2021-06-17 | Disposition: A | Discharge status: Home / Self Care | Payer: Medicare HMO | Source: Ambulatory Visit | Attending: Thoracic Surgery (Cardiothoracic Vascular Surgery) | Admitting: Thoracic Surgery (Cardiothoracic Vascular Surgery)

## 2021-06-17 ENCOUNTER — Encounter (HOSPITAL_COMMUNITY): Payer: Medicare HMO

## 2021-06-17 VITALS — BP 170/90 | HR 85 | Resp 20 | Ht 68.0 in | Wt 174.0 lb

## 2021-06-17 DIAGNOSIS — J988 Other specified respiratory disorders: Secondary | ICD-10-CM | POA: Diagnosis not present

## 2021-06-17 DIAGNOSIS — R0609 Other forms of dyspnea: Secondary | ICD-10-CM | POA: Diagnosis not present

## 2021-06-17 DIAGNOSIS — F419 Anxiety disorder, unspecified: Secondary | ICD-10-CM | POA: Diagnosis not present

## 2021-06-17 DIAGNOSIS — R69 Illness, unspecified: Secondary | ICD-10-CM | POA: Diagnosis not present

## 2021-06-17 DIAGNOSIS — R911 Solitary pulmonary nodule: Secondary | ICD-10-CM

## 2021-06-17 LAB — PULMONARY FUNCTION TEST
DL/VA % pred: 99 %
DL/VA: 4 ml/min/mmHg/L
DLCO unc % pred: 80 %
DLCO unc: 17.83 ml/min/mmHg
FEF 25-75 Post: 3.35 L/sec
FEF 25-75 Pre: 3.4 L/sec
FEF2575-%Change-Post: -1 %
FEF2575-%Pred-Post: 153 %
FEF2575-%Pred-Pre: 155 %
FEV1-%Change-Post: 0 %
FEV1-%Pred-Post: 106 %
FEV1-%Pred-Pre: 106 %
FEV1-Post: 2.86 L
FEV1-Pre: 2.86 L
FEV1FVC-%Change-Post: -1 %
FEV1FVC-%Pred-Pre: 109 %
FEV6-%Change-Post: 0 %
FEV6-%Pred-Post: 102 %
FEV6-%Pred-Pre: 101 %
FEV6-Post: 3.46 L
FEV6-Pre: 3.45 L
FEV6FVC-%Change-Post: 0 %
FEV6FVC-%Pred-Post: 103 %
FEV6FVC-%Pred-Pre: 104 %
FVC-%Change-Post: 1 %
FVC-%Pred-Post: 98 %
FVC-%Pred-Pre: 97 %
FVC-Post: 3.48 L
FVC-Pre: 3.45 L
Post FEV1/FVC ratio: 82 %
Post FEV6/FVC ratio: 99 %
Pre FEV1/FVC ratio: 83 %
Pre FEV6/FVC Ratio: 100 %

## 2021-06-17 MED ORDER — ALBUTEROL SULFATE (2.5 MG/3ML) 0.083% IN NEBU
2.5000 mg | INHALATION_SOLUTION | Freq: Once | RESPIRATORY_TRACT | Status: AC
  Administered 2021-06-17: 2.5 mg via RESPIRATORY_TRACT

## 2021-06-17 NOTE — Progress Notes (Signed)
[address]       [place] [address]             [phone]      HPI: [name] returns to discuss the results of her PET/CT  Julia [name] is a 71 year old woman with a history of hypertension, reflux, palpitations, atrial septal aneurysm, anemia, left upper lobe lung nodule, and COVID-19.  She had a CT for coronary calcium scoring which showed a 2.3 x 2.8 cm well-circumscribed nodule in the lingula.  The nodule was present on CT in 2016 and measured 14 mm at that time.  She was followed for a year and the nodule did not change.  I recommended a PET/CT and pulmonary function testing to help determine the best course of action.  She had this done and now returns.  Her biggest issue currently is her husband who has advanced Parkinson disease and she is a sole caregiver.  Past Medical History:  Diagnosis Date   Anemia    Atrial septal aneurysm    Incidental finding, echo, 2008   Chest pain    Myoview in the past, no definite ischemia,, possible inferior scar   //   cardiac catheterization July 19, 2007,  no significant coronary disease,  EF 55-60%   Ejection fraction    EF 50-55%, echo, February, 2008,  no Carte motion abnormalities     EF 55-60%, catheterization, 2009   GERD (gastroesophageal reflux disease)    Hypertension    Palpitations    Monitor in 2000 showed sinus rhythm and sinus tachycardia. There were occasional supraventricular beats. She was treated with beta blocker    Current Outpatient Medications  Medication Sig Dispense Refill   Biotin 5000 MCG TABS Take by mouth. Take 1 tablet by mouth daily     carvedilol (COREG) 6.25 MG tablet Take 1 tablet (6.25 mg total) by mouth 2 (two) times daily. 180 tablet 3   Cholecalciferol (VITAMIN D-3) 5000 UNITS TABS Take 1 tablet by mouth daily.     CYANOCOBALAMIN IJ Inject 100 mg as directed once a week. Usually on Wednesday     fexofenadine (ALLEGRA) 180 MG tablet Take 180 mg by mouth daily.     meclizine  (ANTIVERT) 25 MG tablet Take 25 mg by mouth 3 (three) times daily as needed for dizziness. Take 1 tablet every 6 hours as needed for dizziness.     Multiple Vitamin (MULTIVITAMIN WITH MINERALS) TABS Take 1 tablet by mouth daily.     No current facility-administered medications for this visit.    Physical Exam BP (!) 170/90   Pulse 85   Resp 20   Ht '5\' 8"'$  (1.727 m)   Wt 174 lb (78.9 kg)   SpO2 97% Comment: RA  BMI 26.70 kg/m  71 year old woman in no acute distress Alert and oriented x3 with no focal deficits Lungs clear bilaterally Cardiac regular rate and rhythm  Diagnostic Tests: NUCLEAR MEDICINE PET SKULL BASE TO THIGH   TECHNIQUE: 9.25 mCi F-18 FDG was injected intravenously. Full-ring PET imaging was performed from the skull base to thigh after the radiotracer. CT data was obtained and used for attenuation correction and anatomic localization.   Fasting blood glucose: 114 mg/dl   COMPARISON:  Calcium score CT chest dated 04/04/2021. CT chest dated 06/08/2014.   FINDINGS: Mediastinal blood pool activity: SUV max 2.3   Liver activity: SUV max NA   NECK: No hypermetabolic cervical lymphadenopathy.   Incidental CT  findings: none   CHEST: 2.5 x 2.4 cm lingular nodule (series 7/image 37), previously 14 x 10 mm in 2016, max SUV 6.4.   No hypermetabolic thoracic lymphadenopathy.   Incidental CT findings: Mild atherosclerotic calcifications of the arch. Mild coronary atherosclerosis of the LAD. Moderate herniation of fat via the esophageal hiatus, progressive.   ABDOMEN/PELVIS: No abnormal hypermetabolism in the liver, spleen, pancreas, or adrenal glands.   No hypermetabolic abdominopelvic lymphadenopathy.   Incidental CT findings: Cholelithiasis, without associated inflammatory changes. Bilateral renal sinus cysts. Benign hepatic cysts. Left colonic diverticulosis, without evidence of diverticulitis. Atherosclerotic calcifications the abdominal aorta and  branch vessels.   SKELETON: No focal hypermetabolic activity to suggest skeletal metastasis.   Incidental CT findings: Degenerative changes of the lumbar spine.   IMPRESSION: Hypermetabolic lingular nodule, progressive from 2016, compatible with primary bronchogenic neoplasm.   No findings suspicious for metastatic disease.     Electronically Signed   By: Julian Hy M.D.   On: 05/31/2021 18:22 I personally reviewed the PET/CT.  There is a hypermetabolic mass in the lingula.  No evidence of regional or distant metastatic disease.  Impression: Julia [name] is a 71 year old woman with a history of hypertension, reflux, palpitations, atrial septal aneurysm, anemia, left upper lobe lung nodule, and COVID-19.   Left upper lobe lung nodule-increase in size from 14 to 25 mm over a 7-year period.  Slow growth but significant metabolic activity on PET/CT.  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  Would not expect that degree of metabolic activity and a hamartoma.  Unlikely to be a primary bronchogenic carcinoma given the timeframe involved.  I discussed these findings with [name].  Discussed that the treatment of carcinoid tumor is a surgical resection.  I think this could be managed with a lingular segmentectomy.  She has essentially normal pulmonary function so could tolerate a lobectomy if necessary.  I described the proposed procedure to her.  We would plan to do this robotically.  I informed her of the need for general anesthesia, the incisions to be used, the use of the surgical robot, the use of drains to postoperatively, the expected hospital stay, and the overall recovery.  She does not want to have surgery currently.  There may be some anxiety about the procedure itself but is mostly related to her husband's issues and her role as the primary caregiver.  Unfortunately, I cannot give her any reassurance that it is safe to wait.  This has grown.  We do not have any  way to know if it remains stable for a long time that started growing suddenly or if its been growing gradually all along.  She chooses not to have surgery at this time.   Plan: Return in 6 months with CT chest to follow-up lingular nodule  Melrose Nakayama, MD [facility] [phone]

## 2021-07-11 DIAGNOSIS — R0600 Dyspnea, unspecified: Secondary | ICD-10-CM | POA: Diagnosis not present

## 2021-07-11 DIAGNOSIS — R911 Solitary pulmonary nodule: Secondary | ICD-10-CM | POA: Diagnosis not present

## 2021-07-15 ENCOUNTER — Telehealth: Payer: Self-pay | Admitting: *Deleted

## 2021-07-15 DIAGNOSIS — R0602 Shortness of breath: Secondary | ICD-10-CM

## 2021-07-15 DIAGNOSIS — Z01810 Encounter for preprocedural cardiovascular examination: Secondary | ICD-10-CM

## 2021-07-15 NOTE — Telephone Encounter (Signed)
Cardiac stress PET scheduled 8/29  Patient reports having surgery scheduled 8/8 and needs cardiac clearance prior.     Per Dr. Nils Flack to change PET to [facility].    Patient aware and verbalized understanding.   Order placed.

## 2021-07-17 ENCOUNTER — Telehealth (HOSPITAL_COMMUNITY): Payer: Self-pay | Admitting: *Deleted

## 2021-07-17 DIAGNOSIS — E538 Deficiency of other specified B group vitamins: Secondary | ICD-10-CM | POA: Diagnosis not present

## 2021-07-17 DIAGNOSIS — E559 Vitamin D deficiency, unspecified: Secondary | ICD-10-CM | POA: Diagnosis not present

## 2021-07-17 DIAGNOSIS — R7303 Prediabetes: Secondary | ICD-10-CM | POA: Diagnosis not present

## 2021-07-17 DIAGNOSIS — E782 Mixed hyperlipidemia: Secondary | ICD-10-CM | POA: Diagnosis not present

## 2021-07-17 NOTE — Telephone Encounter (Signed)
Patient given detailed instructions per Myocardial Perfusion Study Information Sheet for the test on 07/21/2021 at 10:00. Patient notified to arrive 15 minutes early and that it is imperative to arrive on time for appointment to keep from having the test rescheduled.  If you need to cancel or reschedule your appointment, please call the office within 24 hours of your appointment. . Patient verbalized understanding.Julia [name]

## 2021-07-21 ENCOUNTER — Ambulatory Visit (HOSPITAL_COMMUNITY): Payer: Medicare HMO | Attending: Cardiovascular Disease

## 2021-07-21 DIAGNOSIS — Z01810 Encounter for preprocedural cardiovascular examination: Secondary | ICD-10-CM | POA: Insufficient documentation

## 2021-07-21 DIAGNOSIS — R0602 Shortness of breath: Secondary | ICD-10-CM | POA: Insufficient documentation

## 2021-07-21 LAB — MYOCARDIAL PERFUSION IMAGING
LV dias vol: 87 mL (ref 46–106)
LV sys vol: 45 mL
Nuc Stress EF: 49 %
Peak HR: 109 {beats}/min
Rest HR: 76 {beats}/min
Rest Nuclear Isotope Dose: 10.7 mCi
SDS: 2
SRS: 0
SSS: 2
ST Depression (mm): 0 mm
Stress Nuclear Isotope Dose: 32.5 mCi
TID: 1.01

## 2021-07-21 MED ORDER — REGADENOSON 0.4 MG/5ML IV SOLN
0.4000 mg | Freq: Once | INTRAVENOUS | Status: AC
  Administered 2021-07-21: 0.4 mg via INTRAVENOUS

## 2021-07-21 MED ORDER — TECHNETIUM TC 99M TETROFOSMIN IV KIT
32.5000 | PACK | Freq: Once | INTRAVENOUS | Status: AC | PRN
  Administered 2021-07-21: 32.5 via INTRAVENOUS

## 2021-07-21 MED ORDER — TECHNETIUM TC 99M TETROFOSMIN IV KIT
10.7000 | PACK | Freq: Once | INTRAVENOUS | Status: AC | PRN
  Administered 2021-07-21: 10.7 via INTRAVENOUS

## 2021-07-23 ENCOUNTER — Encounter: Payer: Self-pay | Admitting: Cardiology

## 2021-07-24 DIAGNOSIS — D510 Vitamin B12 deficiency anemia due to intrinsic factor deficiency: Secondary | ICD-10-CM | POA: Diagnosis not present

## 2021-07-24 DIAGNOSIS — G4733 Obstructive sleep apnea (adult) (pediatric): Secondary | ICD-10-CM | POA: Diagnosis not present

## 2021-07-24 DIAGNOSIS — E782 Mixed hyperlipidemia: Secondary | ICD-10-CM | POA: Diagnosis not present

## 2021-07-24 DIAGNOSIS — Z8616 Personal history of COVID-19: Secondary | ICD-10-CM | POA: Diagnosis not present

## 2021-07-24 DIAGNOSIS — E663 Overweight: Secondary | ICD-10-CM | POA: Diagnosis not present

## 2021-07-24 DIAGNOSIS — H9313 Tinnitus, bilateral: Secondary | ICD-10-CM | POA: Diagnosis not present

## 2021-07-24 DIAGNOSIS — R002 Palpitations: Secondary | ICD-10-CM | POA: Diagnosis not present

## 2021-07-24 DIAGNOSIS — H9203 Otalgia, bilateral: Secondary | ICD-10-CM | POA: Diagnosis not present

## 2021-07-24 DIAGNOSIS — I10 Essential (primary) hypertension: Secondary | ICD-10-CM | POA: Diagnosis not present

## 2021-07-24 DIAGNOSIS — E559 Vitamin D deficiency, unspecified: Secondary | ICD-10-CM | POA: Diagnosis not present

## 2021-07-24 DIAGNOSIS — R911 Solitary pulmonary nodule: Secondary | ICD-10-CM | POA: Diagnosis not present

## 2021-07-24 DIAGNOSIS — R7303 Prediabetes: Secondary | ICD-10-CM | POA: Diagnosis not present

## 2021-07-30 ENCOUNTER — Encounter: Payer: Self-pay | Admitting: *Deleted

## 2021-08-06 DIAGNOSIS — I10 Essential (primary) hypertension: Secondary | ICD-10-CM | POA: Diagnosis not present

## 2021-08-06 DIAGNOSIS — R0600 Dyspnea, unspecified: Secondary | ICD-10-CM | POA: Diagnosis not present

## 2021-08-06 DIAGNOSIS — E785 Hyperlipidemia, unspecified: Secondary | ICD-10-CM | POA: Diagnosis not present

## 2021-08-06 DIAGNOSIS — R911 Solitary pulmonary nodule: Secondary | ICD-10-CM | POA: Diagnosis not present

## 2021-08-06 DIAGNOSIS — G4733 Obstructive sleep apnea (adult) (pediatric): Secondary | ICD-10-CM | POA: Diagnosis not present

## 2021-08-06 DIAGNOSIS — I471 Supraventricular tachycardia: Secondary | ICD-10-CM | POA: Diagnosis not present

## 2021-08-12 DIAGNOSIS — R911 Solitary pulmonary nodule: Secondary | ICD-10-CM | POA: Diagnosis not present

## 2021-08-12 DIAGNOSIS — C7A090 Malignant carcinoid tumor of the bronchus and lung: Secondary | ICD-10-CM | POA: Diagnosis not present

## 2021-08-12 DIAGNOSIS — J302 Other seasonal allergic rhinitis: Secondary | ICD-10-CM | POA: Diagnosis not present

## 2021-08-12 DIAGNOSIS — Z79899 Other long term (current) drug therapy: Secondary | ICD-10-CM | POA: Diagnosis not present

## 2021-08-12 DIAGNOSIS — I10 Essential (primary) hypertension: Secondary | ICD-10-CM | POA: Diagnosis not present

## 2021-08-12 DIAGNOSIS — D3A090 Benign carcinoid tumor of the bronchus and lung: Secondary | ICD-10-CM | POA: Diagnosis not present

## 2021-08-12 DIAGNOSIS — I471 Supraventricular tachycardia: Secondary | ICD-10-CM | POA: Diagnosis not present

## 2021-08-12 DIAGNOSIS — Z8616 Personal history of COVID-19: Secondary | ICD-10-CM | POA: Diagnosis not present

## 2021-08-12 DIAGNOSIS — E785 Hyperlipidemia, unspecified: Secondary | ICD-10-CM | POA: Diagnosis not present

## 2021-08-21 ENCOUNTER — Encounter: Payer: Self-pay | Admitting: Cardiology

## 2021-08-25 NOTE — Telephone Encounter (Signed)
Forward to Velda Shell for review

## 2021-09-02 ENCOUNTER — Other Ambulatory Visit (HOSPITAL_COMMUNITY): Payer: Medicare HMO

## 2021-09-05 DIAGNOSIS — D3A090 Benign carcinoid tumor of the bronchus and lung: Secondary | ICD-10-CM | POA: Diagnosis not present

## 2021-09-10 DIAGNOSIS — D3A090 Benign carcinoid tumor of the bronchus and lung: Secondary | ICD-10-CM | POA: Diagnosis not present

## 2021-09-10 DIAGNOSIS — C7A090 Malignant carcinoid tumor of the bronchus and lung: Secondary | ICD-10-CM | POA: Diagnosis not present

## 2021-09-15 DIAGNOSIS — Z7189 Other specified counseling: Secondary | ICD-10-CM | POA: Diagnosis not present

## 2021-09-15 DIAGNOSIS — J329 Chronic sinusitis, unspecified: Secondary | ICD-10-CM | POA: Diagnosis not present

## 2021-09-15 DIAGNOSIS — R232 Flushing: Secondary | ICD-10-CM | POA: Diagnosis not present

## 2021-09-15 DIAGNOSIS — K589 Irritable bowel syndrome without diarrhea: Secondary | ICD-10-CM | POA: Diagnosis not present

## 2021-09-15 DIAGNOSIS — C7A090 Malignant carcinoid tumor of the bronchus and lung: Secondary | ICD-10-CM | POA: Diagnosis not present

## 2021-09-15 DIAGNOSIS — R197 Diarrhea, unspecified: Secondary | ICD-10-CM | POA: Diagnosis not present

## 2021-09-18 DIAGNOSIS — L820 Inflamed seborrheic keratosis: Secondary | ICD-10-CM | POA: Diagnosis not present

## 2021-09-18 DIAGNOSIS — D485 Neoplasm of uncertain behavior of skin: Secondary | ICD-10-CM | POA: Diagnosis not present

## 2021-09-23 DIAGNOSIS — D3A090 Benign carcinoid tumor of the bronchus and lung: Secondary | ICD-10-CM | POA: Diagnosis not present

## 2021-09-23 DIAGNOSIS — C7A090 Malignant carcinoid tumor of the bronchus and lung: Secondary | ICD-10-CM | POA: Diagnosis not present

## 2021-09-26 DIAGNOSIS — Z0000 Encounter for general adult medical examination without abnormal findings: Secondary | ICD-10-CM | POA: Diagnosis not present

## 2021-10-01 DIAGNOSIS — K769 Liver disease, unspecified: Secondary | ICD-10-CM | POA: Diagnosis not present

## 2021-10-01 DIAGNOSIS — M1990 Unspecified osteoarthritis, unspecified site: Secondary | ICD-10-CM | POA: Diagnosis not present

## 2021-10-01 DIAGNOSIS — J309 Allergic rhinitis, unspecified: Secondary | ICD-10-CM | POA: Diagnosis not present

## 2021-10-01 DIAGNOSIS — K819 Cholecystitis, unspecified: Secondary | ICD-10-CM | POA: Diagnosis not present

## 2021-10-01 DIAGNOSIS — R7303 Prediabetes: Secondary | ICD-10-CM | POA: Diagnosis not present

## 2021-10-01 DIAGNOSIS — E782 Mixed hyperlipidemia: Secondary | ICD-10-CM | POA: Diagnosis not present

## 2021-10-01 DIAGNOSIS — R911 Solitary pulmonary nodule: Secondary | ICD-10-CM | POA: Diagnosis not present

## 2021-10-08 ENCOUNTER — Ambulatory Visit
Admission: EM | Admit: 2021-10-08 | Discharge: 2021-10-08 | Disposition: A | Discharge status: Home / Self Care | Payer: Medicare HMO | Attending: Nurse Practitioner | Admitting: Nurse Practitioner

## 2021-10-08 DIAGNOSIS — J069 Acute upper respiratory infection, unspecified: Secondary | ICD-10-CM | POA: Diagnosis not present

## 2021-10-08 DIAGNOSIS — Z1152 Encounter for screening for COVID-19: Secondary | ICD-10-CM | POA: Insufficient documentation

## 2021-10-08 DIAGNOSIS — U071 COVID-19: Secondary | ICD-10-CM | POA: Insufficient documentation

## 2021-10-08 LAB — RESP PANEL BY RT-PCR (FLU A&B, COVID) ARPGX2
Influenza A by PCR: NEGATIVE
Influenza B by PCR: NEGATIVE
SARS Coronavirus 2 by RT PCR: POSITIVE — AB

## 2021-10-08 MED ORDER — ACETAMINOPHEN 500 MG PO TABS
500.0000 mg | ORAL_TABLET | Freq: Once | ORAL | Status: AC
Start: 2021-10-08 — End: 2021-10-08
  Administered 2021-10-08: 500 mg via ORAL

## 2021-10-08 NOTE — Discharge Instructions (Addendum)
You have a viral infection, most likely COVID-19 since you tested positive this morning at home.  We are retesting you today and will call you tomorrow if you are positive.  Please stay home and isolate for 5 days from your symptoms starting and wear a mask if you must go in public.  Some things that can make you feel better are: - Increased rest - Increasing fluid with water/sugar free electrolytes - Acetaminophen and ibuprofen as needed for fever/pain.  - Salt water gargling, chloraseptic spray and throat lozenges - OTC guaifenesin (Mucinex) 600 mg twice daily.  - Saline sinus flushes or a neti pot.  - Humidifying the air  If you develop any shortness of breath or chest pain, go to emergency room.

## 2021-10-08 NOTE — ED Provider Notes (Signed)
[facility]    CSN: [id number] Arrival date & time: 10/08/21  0919      History   Chief Complaint Chief Complaint  Patient presents with   Nasal Congestion   Headache    HPI Julia [name] is a 71 y.o. female.   Patient presents for 2 days of upper respiratory symptoms.  She endorses low-grade fever, dry cough that is occasional, nasal congestion, runny nose, sore throat, headache, and fatigue.  She denies chest pain, shortness of breath, ear pain or pressure, abdominal pain, nausea/vomiting, diarrhea, and decreased appetite.  No new rash or loss of taste or smell.  Reports he took a at home COVID test this morning that she thinks may have been expired, however it was positive.  Has taken Tylenol for symptoms with relief.  Reports he tested positive for COVID-19 in May 2022 and took the antiviral therapy at that time.  She is not vaccinated.  Reports if she test positive again here, she would not want to start on antiviral.    Past Medical History:  Diagnosis Date   Anemia    Atrial septal aneurysm    Incidental finding, echo, 2008   Chest pain    Myoview in the past, no definite ischemia,, possible inferior scar   //   cardiac catheterization July 19, 2007,  no significant coronary disease,  EF 55-60%   Ejection fraction    EF 50-55%, echo, February, 2008,  no Eichenberger motion abnormalities     EF 55-60%, catheterization, 2009   GERD (gastroesophageal reflux disease)    Hypertension    Palpitations    Monitor in 2000 showed sinus rhythm and sinus tachycardia. There were occasional supraventricular beats. 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   Vertigo  01/16/2021   Vitamin B12 deficiency (non anemic) 01/16/2021   Peripheral vascular disease, unspecified ([place]) 05/25/2012   Palpitations    GERD (gastroesophageal reflux disease)    Chest pain    Ejection fraction    Atrial septal aneurysm    VITAMIN D DEFICIENCY 07/13/2008   ANEMIA, PERNICIOUS 07/13/2008   HYPERTENSION, UNSPECIFIED 07/13/2008   HYPERLIPIDEMIA 08/01/2007   ANXIETY 08/01/2007   GERD 08/01/2007   OSTEOPENIA 08/01/2007   EPIGASTRIC PAIN 08/01/2007   FRACTURE, PATELLA 02/21/2007   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      Home Medications    Prior to Admission medications   Medication Sig Start Date End Date Taking? Authorizing Provider  Biotin 5000 MCG TABS Take by mouth. Take 1 tablet by mouth daily    [provider]  carvedilol (COREG) 6.25 MG tablet Take 1 tablet (6.25 mg total) by mouth 2 (two) times daily. 05/30/21   Donato Heinz, MD  Cholecalciferol (VITAMIN D-3) 5000 UNITS TABS Take 1 tablet by mouth daily.    [provider]  CYANOCOBALAMIN IJ Inject 100 mg as directed once a week. Usually on Wednesday  [provider]  fexofenadine (ALLEGRA) 180 MG tablet Take 180 mg by mouth daily.    [provider]  meclizine (ANTIVERT) 25 MG tablet Take 25 mg by mouth 3 (three) times daily as needed for dizziness. Take 1 tablet every 6 hours as needed for dizziness.    [provider]  Multiple Vitamin (MULTIVITAMIN WITH MINERALS) TABS Take 1 tablet by mouth daily.    [provider]  cetirizine (ZYRTEC) 10 MG tablet Take 10 mg by mouth daily.  05/23/20  [provider]  medroxyPROGESTERone (PROVERA) 2.5 MG tablet Take 2.5 mg by mouth daily.  05/23/20  [provider]   omeprazole (PRILOSEC) 20 MG capsule Take 20 mg by mouth daily.  05/23/20  [provider]  thyroid (ARMOUR) 30 MG tablet Take 30 mg by mouth daily.  05/23/20  [provider]    Family History Family History  Problem Relation Age of Onset   Heart attack Mother    Heart attack Father     Social History Social History   Tobacco Use   Smoking status: Never   Smokeless tobacco: Never  Substance Use Topics   Alcohol use: No     Allergies   Elemental sulfur and Sulfa antibiotics   Review of Systems Review of Systems Per HPI  Physical Exam Triage Vital Signs ED Triage Vitals  Enc Vitals Group     BP 10/08/21 0936 128/84     Pulse Rate 10/08/21 0936 97     Resp 10/08/21 0936 18     Temp 10/08/21 0936 (!) 100.5 F (38.1 C)     Temp Source 10/08/21 0936 Oral     SpO2 10/08/21 0936 95 %     Weight --      Height --      Head Circumference --      Peak Flow --      Pain Score 10/08/21 0935 5     Pain Loc --      Pain Edu? --      Excl. in [place]? --    No data found.  Updated Vital Signs BP 128/84 (BP Location: Right Arm)   Pulse 97   Temp (!) 100.5 F (38.1 C) (Oral)   Resp 18   SpO2 95%   Visual Acuity Right Eye Distance:   Left Eye Distance:   Bilateral Distance:    Right Eye Near:   Left Eye Near:    Bilateral Near:     Physical Exam Vitals and nursing note reviewed.  Constitutional:      General: She is not in acute distress.    Appearance: Normal appearance. She is not ill-appearing or toxic-appearing.  HENT:     Head: Normocephalic and atraumatic.     Right Ear: Tympanic membrane, ear canal and external ear normal.     Left Ear: Tympanic membrane, ear canal and external ear normal.     Nose: Congestion and rhinorrhea present.     Mouth/Throat:     Mouth: Mucous membranes are moist.     Pharynx: Oropharynx is clear. No oropharyngeal exudate or posterior oropharyngeal erythema.  Eyes:     General: No scleral icterus.     Extraocular Movements: Extraocular movements intact.  Cardiovascular:     Rate and Rhythm: Normal rate and regular rhythm.  Pulmonary:     Effort: Pulmonary effort is normal. No respiratory distress.     Breath sounds: Normal breath sounds. No wheezing, rhonchi or rales.  Musculoskeletal:     Cervical back: Normal range of motion and neck supple.  Lymphadenopathy:     Cervical: No cervical adenopathy.  Skin:    General: Skin is warm and dry.     Coloration: Skin is not jaundiced or pale.     Findings: No erythema or rash.  Neurological:     Mental Status: She is alert and oriented to person, place, and time.  Psychiatric:        Behavior: Behavior is cooperative.      UC Treatments / Results  Labs (all labs ordered are listed, but only abnormal results are displayed) Labs Reviewed  RESP PANEL BY RT-PCR (FLU A&B, COVID) [id number]    EKG   Radiology No results found.  Procedures Procedures (including critical care time)  Medications Ordered in UC Medications  acetaminophen (TYLENOL) tablet 500 mg (500 mg Oral Given 10/08/21 0939)    Initial Impression / Assessment and Plan / UC Course  I have reviewed the triage vital signs and the nursing notes.  Pertinent labs & imaging results that were available during my care of the patient were reviewed by me and considered in my medical decision making (see chart for details).    Patient is well-appearing, normotensive, not tachycardic, not tachypneic, oxygenating well on room air.  She has a low-grade fever today in urgent care and was treated with Tylenol.  Discussed with her that she likely has COVID-19 since she tested positive at home.  We will retest for confirmation.  Patient declines antiviral therapy at this time.  Supportive care discussed.  ER and return precautions discussed.  The patient was given the opportunity to ask questions.  All questions answered to their satisfaction.  The patient is in agreement to this plan.     Final Clinical Impressions(s) / UC Diagnoses   Final diagnoses:  Encounter for screening for COVID-19  Viral URI  Positive self-administered antigen test for COVID-19     Discharge Instructions      You have a viral infection, most likely COVID-19 since you tested positive this morning at home.  We are retesting you today and will call you tomorrow if you are positive.  Please stay home and isolate for 5 days from your symptoms starting and wear a mask if you must go in public.  Some things that can make you feel better are: - Increased rest - Increasing fluid with water/sugar free electrolytes - Acetaminophen and ibuprofen as needed for fever/pain.  - Salt water gargling, chloraseptic spray and throat lozenges - OTC guaifenesin (Mucinex) 600 mg twice daily.  - Saline sinus flushes or a neti pot.  - Humidifying the air  If you develop any shortness of breath or chest pain, go to emergency room.      ED Prescriptions   None    PDMP not reviewed this encounter.   Eulogio Bear, NP 10/08/21 1004

## 2021-10-08 NOTE — ED Triage Notes (Signed)
Pt reports chills x 1 day; nasal congestion, headache and fatigue x 2 days. Reports she was in a reunion 4 days ago and think she was exposed to COVID or allergies form cleaning her carpets.

## 2021-11-11 ENCOUNTER — Other Ambulatory Visit: Payer: Self-pay | Admitting: Thoracic Surgery (Cardiothoracic Vascular Surgery)

## 2021-11-11 DIAGNOSIS — R911 Solitary pulmonary nodule: Secondary | ICD-10-CM

## 2021-12-04 NOTE — Progress Notes (Unsigned)
Cardiology Office Note:    Date:  12/10/2021   ID:  Julia [name], DOB 10-19-1950, MRN [id number]  PCP:  Celene Squibb, MD  Cardiologist:  Donato Heinz, MD  Electrophysiologist:  None   Referring MD: Celene Squibb, MD   No chief complaint on file.   History of Present Illness:    Julia [name] is a 71 y.o. female with a hx of hypertension, OSA who presents for follow-up.  She was referred by Dr. Nevada Crane for evaluation of chest pain/tachycardia, initially seen on 02/20/2021.  She previously followed with Dr. Ron Parker in cardiology but has not been seen since 03-27-2011.  Had borderline abnormal nuclear stress test in 03-27-2007 and underwent cardiac catheterization which showed no significant CAD.   She reports that she had COVID-19 infection last May.  Reports has not felt the same since that time.  Has been having palpitations and feeling like heart is racing.  Reports heart rate will be up to 120s at rest.  Reports palpitations occurring at least once per week.  Reports chest pain when she eats spicy foods otherwise denies any chest pain.  No exertional chest pain but does report some dyspnea.  Has been having lightheadedness but denies any syncope.  No lower extremity edema.  Reports BP 130s over 70s when checks at home.  No smoking history.  Family history includes father died of MI at age 32, mother died of MI at age 23, and brother died during heart surgery in 03-27-18 but she is unsure of details.  Calcium score 33 (61st percentile) on 04/04/2021.  Also showed large lung nodule, increased in size from March 27, 2014 for which she was referred to thoracic surgery.  Echocardiogram 03/18/2021 showed normal biventricular function, no significant valvular disease.  Zio patch x14 days showed 1 episode of NSVT lasting 10 beats, 6 episodes of SVT with longest lasting 13 beats, occasional PVCs (1.4% of beats).  Lexiscan Myoview on 07/21/2021 showed normal perfusion, EF 49%.  Since last clinic visit, she reports she is doing  okay.  Continues to have palpitations, primarily related to stress, but overall feeling improved.  Reports no dyspnea.  Did have some chest pain after eating once but otherwise denies any chest pain.   Past Medical History:  Diagnosis Date   Anemia    Atrial septal aneurysm    Incidental finding, echo, 2006/03/27   Chest pain    Myoview in the past, no definite ischemia,, possible inferior scar   //   cardiac catheterization July 19, 2007,  no significant coronary disease,  EF 55-60%   Ejection fraction    EF 50-55%, echo, February, 2008,  no Rothman motion abnormalities     EF 55-60%, catheterization, 2007/03/27   GERD (gastroesophageal reflux disease)    Hypertension    Palpitations    Monitor in 1998-03-27 showed sinus rhythm and sinus tachycardia. There were occasional supraventricular beats. She was treated with beta blocker    Past Surgical History:  Procedure Laterality Date   KNEE RECONSTRUCTION Right 03/27/06   TUBAL LIGATION      Current Medications: Current Meds  Medication Sig   Biotin 5000 MCG TABS Take by mouth. Take 1 tablet by mouth daily   carvedilol (COREG) 6.25 MG tablet Take 1 tablet (6.25 mg total) by mouth 2 (two) times daily.   Cholecalciferol (VITAMIN D-3) 5000 UNITS TABS Take 1 tablet by mouth daily.   co-enzyme Q-10 30 MG capsule Take 30 mg by mouth daily.  CYANOCOBALAMIN IJ Inject 100 mg as directed once a week. Usually on Wednesday   fexofenadine (ALLEGRA) 180 MG tablet Take 180 mg by mouth daily.   meclizine (ANTIVERT) 25 MG tablet Take 25 mg by mouth 3 (three) times daily as needed for dizziness. Take 1 tablet every 6 hours as needed for dizziness.   Menaquinone-7 (VITAMIN K2 PO) Take by mouth.   Multiple Vitamin (MULTIVITAMIN WITH MINERALS) TABS Take 1 tablet by mouth daily.     Allergies:   Elemental sulfur and Sulfa antibiotics   Social History   Socioeconomic History   Marital status: Married    Spouse name: Not on file   Number of children: Not on file    Years of education: Not on file   Highest education level: Not on file  Occupational History   Not on file  Tobacco Use   Smoking status: Never   Smokeless tobacco: Never  Substance and Sexual Activity   Alcohol use: No   Drug use: Not on file   Sexual activity: Not on file  Other Topics Concern   Not on file  Social History Narrative   Not on file   Social Determinants of Health   Financial Resource Strain: Not on file  Food Insecurity: Not on file  Transportation Needs: Not on file  Physical Activity: Not on file  Stress: Not on file  Social Connections: Not on file     Family History: The patient's family history includes Heart attack in her father and mother.  ROS:   Please see the history of present illness.     All other systems reviewed and are negative.  EKGs/Labs/Other Studies Reviewed:    The following studies were reviewed today:   EKG:   12/05/21: Normal sinus rhythm, rate 82, no ST abnormalities 05/30/21: Normal sinus rhythm, rate 96, no ST abnormalities 02/21/20: Sinus tachycardia, rate 107, no ST abnormality  Recent Labs: No results found for requested labs within last 365 days.  Recent Lipid Panel    Component Value Date/Time   CHOL 207 (HH) 08/01/2007 1348   TRIG 126 08/01/2007 1348   HDL 41.2 08/01/2007 1348   CHOLHDL 5.0 CALC 08/01/2007 1348   VLDL 25 08/01/2007 1348   LDLDIRECT 139.4 08/01/2007 1348    Physical Exam:    VS:  BP (!) 140/84   Pulse 82   Ht '5\' 8"'$  (1.727 m)   Wt 158 lb (71.7 kg)   BMI 24.02 kg/m     Wt Readings from Last 3 Encounters:  12/05/21 158 lb (71.7 kg)  07/21/21 174 lb (78.9 kg)  06/17/21 174 lb (78.9 kg)     GEN:  Well nourished, well developed in no acute distress HEENT: Normal NECK: No JVD; No carotid bruits CARDIAC: RRR, no murmurs, rubs, gallops RESPIRATORY:  Clear to auscultation without rales, wheezing or rhonchi  ABDOMEN: Soft, non-tender, non-distended MUSCULOSKELETAL:  No edema; No deformity   SKIN: Warm and dry NEUROLOGIC:  Alert and oriented x 3 PSYCHIATRIC:  Normal affect   ASSESSMENT:    1. Palpitations   2. Essential hypertension   3. Shortness of breath   4. Hyperlipidemia, unspecified hyperlipidemia type      PLAN:    Palpitations Zio patch x14 days showed 1 episode of NSVT lasting 10 beats, 6 episodes of SVT with longest lasting 13 beats, occasional PVCs (1.4% of beats).  Continue carvedilol 6.25 mg twice daily  Dyspnea: Reports having dyspnea with exertion, particular when walking up stairs.  Echocardiogram 03/18/2021 showed normal biventricular function, no significant valvular disease. Lexiscan Myoview on 07/21/2021 showed normal perfusion, EF 49%.  Hypertension: On carvedilol 6.25 mg twice daily.  BP elevated, recommended increasing to 12.5 mg twice daily but she declines  Hyperlipidemia: LDL 159 on 01/09/2021.  Has refused statin.  Calcium score 33 (61st percentile) on 04/04/2021 -Discussed starting statin or other cholesterol medication, but she declines any medications for her cholesterol.  LDL improved to 94 on 10/01/2021 with lifestyle changes  OSA: Not on CPAP but uses dental appliance  Lung nodule: Large lung nodule noted within the lingula on calcium score 04/04/2021, increased in size from 2016.  Referred to Dr. Roxan Hockey in thoracic surgery, PET scan showed significant metabolic activity.  Surgery recommended but she has declined.  She is seeing oncologist at [facility], monitoring for now  RTC in 6 months   Medication Adjustments/Labs and Tests Ordered: Current medicines are reviewed at length with the patient today.  Concerns regarding medicines are outlined above.  Orders Placed This Encounter  Procedures   EKG 12-Lead   No orders of the defined types were placed in this encounter.   Patient Instructions  Medication Instructions:  Your physician recommends that you continue on your current medications as directed. Please refer to the Current  Medication list given to you today.  *If you need a refill on your cardiac medications before your next appointment, please call your pharmacy*  Follow-Up: At [facility], you and your health needs are our priority.  As part of our continuing mission to provide you with exceptional heart care, we have created designated Provider Care Teams.  These Care Teams include your primary Cardiologist (physician) and Advanced Practice Providers (APPs -  Physician Assistants and Nurse Practitioners) who all work together to provide you with the care you need, when you need it.  We recommend signing up for the patient portal called "MyChart".  Sign up information is provided on this After Visit Summary.  MyChart is used to connect with patients for Virtual Visits (Telemedicine).  Patients are able to view lab/test results, encounter notes, upcoming appointments, etc.  Non-urgent messages can be sent to your provider as well.   To learn more about what you can do with MyChart, go to [email or web address].    Your next appointment:   12 month(s)  The format for your next appointment:   In Person  Provider:   Donato Heinz, MD           Signed, Donato Heinz, MD  12/10/2021 3:41 PM    [place]

## 2021-12-05 ENCOUNTER — Encounter: Payer: Self-pay | Admitting: Cardiology

## 2021-12-05 ENCOUNTER — Ambulatory Visit: Payer: Medicare HMO | Attending: Cardiology | Admitting: Cardiology

## 2021-12-05 VITALS — BP 140/84 | HR 82 | Ht 68.0 in | Wt 158.0 lb

## 2021-12-05 DIAGNOSIS — E785 Hyperlipidemia, unspecified: Secondary | ICD-10-CM | POA: Diagnosis not present

## 2021-12-05 DIAGNOSIS — R002 Palpitations: Secondary | ICD-10-CM | POA: Diagnosis not present

## 2021-12-05 DIAGNOSIS — R0602 Shortness of breath: Secondary | ICD-10-CM

## 2021-12-05 DIAGNOSIS — I10 Essential (primary) hypertension: Secondary | ICD-10-CM | POA: Diagnosis not present

## 2021-12-05 NOTE — Patient Instructions (Signed)
Medication Instructions:  Your physician recommends that you continue on your current medications as directed. Please refer to the Current Medication list given to you today.  *If you need a refill on your cardiac medications before your next appointment, please call your pharmacy*  Follow-Up: At [facility], you and your health needs are our priority.  As part of our continuing mission to provide you with exceptional heart care, we have created designated Provider Care Teams.  These Care Teams include your primary Cardiologist (physician) and Advanced Practice Providers (APPs -  Physician Assistants and Nurse Practitioners) who all work together to provide you with the care you need, when you need it.  We recommend signing up for the patient portal called "MyChart".  Sign up information is provided on this After Visit Summary.  MyChart is used to connect with patients for Virtual Visits (Telemedicine).  Patients are able to view lab/test results, encounter notes, upcoming appointments, etc.  Non-urgent messages can be sent to your provider as well.   To learn more about what you can do with MyChart, go to [email or web address].    Your next appointment:   12 month(s)  The format for your next appointment:   In Person  Provider:   Donato Heinz, MD

## 2021-12-12 ENCOUNTER — Other Ambulatory Visit (HOSPITAL_COMMUNITY): Payer: Medicare HMO

## 2021-12-15 DIAGNOSIS — R911 Solitary pulmonary nodule: Secondary | ICD-10-CM | POA: Diagnosis not present

## 2021-12-15 DIAGNOSIS — I10 Essential (primary) hypertension: Secondary | ICD-10-CM | POA: Diagnosis not present

## 2021-12-15 DIAGNOSIS — R002 Palpitations: Secondary | ICD-10-CM | POA: Diagnosis not present

## 2021-12-15 DIAGNOSIS — M1990 Unspecified osteoarthritis, unspecified site: Secondary | ICD-10-CM | POA: Diagnosis not present

## 2021-12-15 DIAGNOSIS — R69 Illness, unspecified: Secondary | ICD-10-CM | POA: Diagnosis not present

## 2021-12-15 DIAGNOSIS — F411 Generalized anxiety disorder: Secondary | ICD-10-CM | POA: Diagnosis not present

## 2021-12-15 DIAGNOSIS — E782 Mixed hyperlipidemia: Secondary | ICD-10-CM | POA: Diagnosis not present

## 2021-12-15 DIAGNOSIS — R7303 Prediabetes: Secondary | ICD-10-CM | POA: Diagnosis not present

## 2021-12-16 ENCOUNTER — Ambulatory Visit: Payer: Medicare HMO | Admitting: Thoracic Surgery (Cardiothoracic Vascular Surgery)

## 2021-12-22 DIAGNOSIS — H47093 Other disorders of optic nerve, not elsewhere classified, bilateral: Secondary | ICD-10-CM | POA: Diagnosis not present

## 2022-01-06 DIAGNOSIS — D3A090 Benign carcinoid tumor of the bronchus and lung: Secondary | ICD-10-CM | POA: Diagnosis not present

## 2022-01-20 DIAGNOSIS — E782 Mixed hyperlipidemia: Secondary | ICD-10-CM | POA: Diagnosis not present

## 2022-01-20 DIAGNOSIS — D519 Vitamin B12 deficiency anemia, unspecified: Secondary | ICD-10-CM | POA: Diagnosis not present

## 2022-01-20 DIAGNOSIS — E559 Vitamin D deficiency, unspecified: Secondary | ICD-10-CM | POA: Diagnosis not present

## 2022-01-20 DIAGNOSIS — R7303 Prediabetes: Secondary | ICD-10-CM | POA: Diagnosis not present

## 2022-01-23 DIAGNOSIS — M1990 Unspecified osteoarthritis, unspecified site: Secondary | ICD-10-CM | POA: Diagnosis not present

## 2022-01-23 DIAGNOSIS — R7303 Prediabetes: Secondary | ICD-10-CM | POA: Diagnosis not present

## 2022-01-23 DIAGNOSIS — F411 Generalized anxiety disorder: Secondary | ICD-10-CM | POA: Diagnosis not present

## 2022-01-23 DIAGNOSIS — E559 Vitamin D deficiency, unspecified: Secondary | ICD-10-CM | POA: Diagnosis not present

## 2022-01-23 DIAGNOSIS — R002 Palpitations: Secondary | ICD-10-CM | POA: Diagnosis not present

## 2022-01-23 DIAGNOSIS — I10 Essential (primary) hypertension: Secondary | ICD-10-CM | POA: Diagnosis not present

## 2022-01-23 DIAGNOSIS — H9313 Tinnitus, bilateral: Secondary | ICD-10-CM | POA: Diagnosis not present

## 2022-01-23 DIAGNOSIS — R69 Illness, unspecified: Secondary | ICD-10-CM | POA: Diagnosis not present

## 2022-01-23 DIAGNOSIS — G4733 Obstructive sleep apnea (adult) (pediatric): Secondary | ICD-10-CM | POA: Diagnosis not present

## 2022-01-23 DIAGNOSIS — D510 Vitamin B12 deficiency anemia due to intrinsic factor deficiency: Secondary | ICD-10-CM | POA: Diagnosis not present

## 2022-01-23 DIAGNOSIS — C7A090 Malignant carcinoid tumor of the bronchus and lung: Secondary | ICD-10-CM | POA: Diagnosis not present

## 2022-01-23 DIAGNOSIS — Z0001 Encounter for general adult medical examination with abnormal findings: Secondary | ICD-10-CM | POA: Diagnosis not present

## 2022-01-23 DIAGNOSIS — E782 Mixed hyperlipidemia: Secondary | ICD-10-CM | POA: Diagnosis not present

## 2022-02-05 DIAGNOSIS — M9901 Segmental and somatic dysfunction of cervical region: Secondary | ICD-10-CM | POA: Diagnosis not present

## 2022-02-05 DIAGNOSIS — R42 Dizziness and giddiness: Secondary | ICD-10-CM | POA: Diagnosis not present

## 2022-02-05 DIAGNOSIS — S161XXA Strain of muscle, fascia and tendon at neck level, initial encounter: Secondary | ICD-10-CM | POA: Diagnosis not present

## 2022-02-10 DIAGNOSIS — R42 Dizziness and giddiness: Secondary | ICD-10-CM | POA: Diagnosis not present

## 2022-02-10 DIAGNOSIS — S161XXA Strain of muscle, fascia and tendon at neck level, initial encounter: Secondary | ICD-10-CM | POA: Diagnosis not present

## 2022-02-10 DIAGNOSIS — M9901 Segmental and somatic dysfunction of cervical region: Secondary | ICD-10-CM | POA: Diagnosis not present

## 2022-02-19 DIAGNOSIS — S161XXA Strain of muscle, fascia and tendon at neck level, initial encounter: Secondary | ICD-10-CM | POA: Diagnosis not present

## 2022-02-19 DIAGNOSIS — M9901 Segmental and somatic dysfunction of cervical region: Secondary | ICD-10-CM | POA: Diagnosis not present

## 2022-02-19 DIAGNOSIS — R42 Dizziness and giddiness: Secondary | ICD-10-CM | POA: Diagnosis not present

## 2022-02-24 DIAGNOSIS — M9901 Segmental and somatic dysfunction of cervical region: Secondary | ICD-10-CM | POA: Diagnosis not present

## 2022-02-24 DIAGNOSIS — S161XXA Strain of muscle, fascia and tendon at neck level, initial encounter: Secondary | ICD-10-CM | POA: Diagnosis not present

## 2022-02-24 DIAGNOSIS — R42 Dizziness and giddiness: Secondary | ICD-10-CM | POA: Diagnosis not present

## 2022-03-03 DIAGNOSIS — M9901 Segmental and somatic dysfunction of cervical region: Secondary | ICD-10-CM | POA: Diagnosis not present

## 2022-03-03 DIAGNOSIS — S161XXA Strain of muscle, fascia and tendon at neck level, initial encounter: Secondary | ICD-10-CM | POA: Diagnosis not present

## 2022-03-03 DIAGNOSIS — R42 Dizziness and giddiness: Secondary | ICD-10-CM | POA: Diagnosis not present

## 2022-03-10 DIAGNOSIS — S161XXA Strain of muscle, fascia and tendon at neck level, initial encounter: Secondary | ICD-10-CM | POA: Diagnosis not present

## 2022-03-10 DIAGNOSIS — M9901 Segmental and somatic dysfunction of cervical region: Secondary | ICD-10-CM | POA: Diagnosis not present

## 2022-03-10 DIAGNOSIS — R42 Dizziness and giddiness: Secondary | ICD-10-CM | POA: Diagnosis not present

## 2022-03-17 DIAGNOSIS — R42 Dizziness and giddiness: Secondary | ICD-10-CM | POA: Diagnosis not present

## 2022-03-17 DIAGNOSIS — M9901 Segmental and somatic dysfunction of cervical region: Secondary | ICD-10-CM | POA: Diagnosis not present

## 2022-03-17 DIAGNOSIS — S161XXA Strain of muscle, fascia and tendon at neck level, initial encounter: Secondary | ICD-10-CM | POA: Diagnosis not present

## 2022-03-20 DIAGNOSIS — R69 Illness, unspecified: Secondary | ICD-10-CM | POA: Diagnosis not present

## 2022-03-24 DIAGNOSIS — R42 Dizziness and giddiness: Secondary | ICD-10-CM | POA: Diagnosis not present

## 2022-03-24 DIAGNOSIS — M9901 Segmental and somatic dysfunction of cervical region: Secondary | ICD-10-CM | POA: Diagnosis not present

## 2022-03-24 DIAGNOSIS — S161XXA Strain of muscle, fascia and tendon at neck level, initial encounter: Secondary | ICD-10-CM | POA: Diagnosis not present

## 2022-04-02 DIAGNOSIS — S161XXA Strain of muscle, fascia and tendon at neck level, initial encounter: Secondary | ICD-10-CM | POA: Diagnosis not present

## 2022-04-02 DIAGNOSIS — R42 Dizziness and giddiness: Secondary | ICD-10-CM | POA: Diagnosis not present

## 2022-04-02 DIAGNOSIS — M9901 Segmental and somatic dysfunction of cervical region: Secondary | ICD-10-CM | POA: Diagnosis not present

## 2022-04-07 DIAGNOSIS — R69 Illness, unspecified: Secondary | ICD-10-CM | POA: Diagnosis not present

## 2022-04-09 DIAGNOSIS — S161XXA Strain of muscle, fascia and tendon at neck level, initial encounter: Secondary | ICD-10-CM | POA: Diagnosis not present

## 2022-04-09 DIAGNOSIS — M9901 Segmental and somatic dysfunction of cervical region: Secondary | ICD-10-CM | POA: Diagnosis not present

## 2022-04-09 DIAGNOSIS — R42 Dizziness and giddiness: Secondary | ICD-10-CM | POA: Diagnosis not present

## 2022-04-16 DIAGNOSIS — R42 Dizziness and giddiness: Secondary | ICD-10-CM | POA: Diagnosis not present

## 2022-04-16 DIAGNOSIS — M9901 Segmental and somatic dysfunction of cervical region: Secondary | ICD-10-CM | POA: Diagnosis not present

## 2022-04-16 DIAGNOSIS — S161XXA Strain of muscle, fascia and tendon at neck level, initial encounter: Secondary | ICD-10-CM | POA: Diagnosis not present

## 2022-04-22 DIAGNOSIS — R922 Inconclusive mammogram: Secondary | ICD-10-CM | POA: Diagnosis not present

## 2022-04-22 DIAGNOSIS — R928 Other abnormal and inconclusive findings on diagnostic imaging of breast: Secondary | ICD-10-CM | POA: Diagnosis not present

## 2022-04-22 DIAGNOSIS — R921 Mammographic calcification found on diagnostic imaging of breast: Secondary | ICD-10-CM | POA: Diagnosis not present

## 2022-04-23 DIAGNOSIS — M9901 Segmental and somatic dysfunction of cervical region: Secondary | ICD-10-CM | POA: Diagnosis not present

## 2022-04-23 DIAGNOSIS — R42 Dizziness and giddiness: Secondary | ICD-10-CM | POA: Diagnosis not present

## 2022-04-23 DIAGNOSIS — S161XXA Strain of muscle, fascia and tendon at neck level, initial encounter: Secondary | ICD-10-CM | POA: Diagnosis not present

## 2022-04-30 DIAGNOSIS — M9901 Segmental and somatic dysfunction of cervical region: Secondary | ICD-10-CM | POA: Diagnosis not present

## 2022-04-30 DIAGNOSIS — S161XXA Strain of muscle, fascia and tendon at neck level, initial encounter: Secondary | ICD-10-CM | POA: Diagnosis not present

## 2022-04-30 DIAGNOSIS — R42 Dizziness and giddiness: Secondary | ICD-10-CM | POA: Diagnosis not present

## 2022-05-07 DIAGNOSIS — M9901 Segmental and somatic dysfunction of cervical region: Secondary | ICD-10-CM | POA: Diagnosis not present

## 2022-05-07 DIAGNOSIS — R42 Dizziness and giddiness: Secondary | ICD-10-CM | POA: Diagnosis not present

## 2022-05-07 DIAGNOSIS — S161XXA Strain of muscle, fascia and tendon at neck level, initial encounter: Secondary | ICD-10-CM | POA: Diagnosis not present

## 2022-05-14 DIAGNOSIS — R42 Dizziness and giddiness: Secondary | ICD-10-CM | POA: Diagnosis not present

## 2022-05-14 DIAGNOSIS — S161XXA Strain of muscle, fascia and tendon at neck level, initial encounter: Secondary | ICD-10-CM | POA: Diagnosis not present

## 2022-05-14 DIAGNOSIS — M9901 Segmental and somatic dysfunction of cervical region: Secondary | ICD-10-CM | POA: Diagnosis not present

## 2022-05-21 DIAGNOSIS — R42 Dizziness and giddiness: Secondary | ICD-10-CM | POA: Diagnosis not present

## 2022-05-21 DIAGNOSIS — S161XXA Strain of muscle, fascia and tendon at neck level, initial encounter: Secondary | ICD-10-CM | POA: Diagnosis not present

## 2022-05-21 DIAGNOSIS — M9901 Segmental and somatic dysfunction of cervical region: Secondary | ICD-10-CM | POA: Diagnosis not present

## 2022-05-27 ENCOUNTER — Other Ambulatory Visit: Payer: Self-pay | Admitting: Cardiology

## 2022-06-11 DIAGNOSIS — R42 Dizziness and giddiness: Secondary | ICD-10-CM | POA: Diagnosis not present

## 2022-06-11 DIAGNOSIS — S161XXA Strain of muscle, fascia and tendon at neck level, initial encounter: Secondary | ICD-10-CM | POA: Diagnosis not present

## 2022-06-11 DIAGNOSIS — M9901 Segmental and somatic dysfunction of cervical region: Secondary | ICD-10-CM | POA: Diagnosis not present

## 2022-06-25 DIAGNOSIS — R42 Dizziness and giddiness: Secondary | ICD-10-CM | POA: Diagnosis not present

## 2022-06-25 DIAGNOSIS — S161XXA Strain of muscle, fascia and tendon at neck level, initial encounter: Secondary | ICD-10-CM | POA: Diagnosis not present

## 2022-06-25 DIAGNOSIS — M9901 Segmental and somatic dysfunction of cervical region: Secondary | ICD-10-CM | POA: Diagnosis not present

## 2022-07-07 DIAGNOSIS — D3A090 Benign carcinoid tumor of the bronchus and lung: Secondary | ICD-10-CM | POA: Diagnosis not present

## 2022-07-16 DIAGNOSIS — R42 Dizziness and giddiness: Secondary | ICD-10-CM | POA: Diagnosis not present

## 2022-07-16 DIAGNOSIS — S161XXA Strain of muscle, fascia and tendon at neck level, initial encounter: Secondary | ICD-10-CM | POA: Diagnosis not present

## 2022-07-16 DIAGNOSIS — M9901 Segmental and somatic dysfunction of cervical region: Secondary | ICD-10-CM | POA: Diagnosis not present

## 2022-07-17 LAB — EXTERNAL GENERIC LAB PROCEDURE

## 2022-07-17 LAB — COLOGUARD

## 2022-07-23 DIAGNOSIS — E782 Mixed hyperlipidemia: Secondary | ICD-10-CM | POA: Diagnosis not present

## 2022-07-23 DIAGNOSIS — R7303 Prediabetes: Secondary | ICD-10-CM | POA: Diagnosis not present

## 2022-07-23 DIAGNOSIS — E559 Vitamin D deficiency, unspecified: Secondary | ICD-10-CM | POA: Diagnosis not present

## 2022-07-30 DIAGNOSIS — E782 Mixed hyperlipidemia: Secondary | ICD-10-CM | POA: Diagnosis not present

## 2022-07-30 DIAGNOSIS — I10 Essential (primary) hypertension: Secondary | ICD-10-CM | POA: Diagnosis not present

## 2022-07-30 DIAGNOSIS — R7303 Prediabetes: Secondary | ICD-10-CM | POA: Diagnosis not present

## 2022-07-30 DIAGNOSIS — E559 Vitamin D deficiency, unspecified: Secondary | ICD-10-CM | POA: Diagnosis not present

## 2022-07-30 DIAGNOSIS — C7A090 Malignant carcinoid tumor of the bronchus and lung: Secondary | ICD-10-CM | POA: Diagnosis not present

## 2022-07-30 DIAGNOSIS — R002 Palpitations: Secondary | ICD-10-CM | POA: Diagnosis not present

## 2022-07-30 DIAGNOSIS — D510 Vitamin B12 deficiency anemia due to intrinsic factor deficiency: Secondary | ICD-10-CM | POA: Diagnosis not present

## 2022-07-30 DIAGNOSIS — F411 Generalized anxiety disorder: Secondary | ICD-10-CM | POA: Diagnosis not present

## 2022-07-30 DIAGNOSIS — Z8616 Personal history of COVID-19: Secondary | ICD-10-CM | POA: Diagnosis not present

## 2022-07-30 DIAGNOSIS — M1990 Unspecified osteoarthritis, unspecified site: Secondary | ICD-10-CM | POA: Diagnosis not present

## 2022-07-31 DIAGNOSIS — Z1211 Encounter for screening for malignant neoplasm of colon: Secondary | ICD-10-CM | POA: Diagnosis not present

## 2022-08-05 LAB — COLOGUARD: COLOGUARD: POSITIVE — AB

## 2022-08-05 LAB — EXTERNAL GENERIC LAB PROCEDURE: COLOGUARD: POSITIVE — AB

## 2022-08-19 DIAGNOSIS — R42 Dizziness and giddiness: Secondary | ICD-10-CM | POA: Diagnosis not present

## 2022-08-19 DIAGNOSIS — S161XXA Strain of muscle, fascia and tendon at neck level, initial encounter: Secondary | ICD-10-CM | POA: Diagnosis not present

## 2022-08-19 DIAGNOSIS — M9901 Segmental and somatic dysfunction of cervical region: Secondary | ICD-10-CM | POA: Diagnosis not present

## 2022-09-02 DIAGNOSIS — M545 Low back pain, unspecified: Secondary | ICD-10-CM | POA: Diagnosis not present

## 2022-09-02 DIAGNOSIS — R42 Dizziness and giddiness: Secondary | ICD-10-CM | POA: Diagnosis not present

## 2022-09-02 DIAGNOSIS — S161XXA Strain of muscle, fascia and tendon at neck level, initial encounter: Secondary | ICD-10-CM | POA: Diagnosis not present

## 2022-09-02 DIAGNOSIS — M25552 Pain in left hip: Secondary | ICD-10-CM | POA: Diagnosis not present

## 2022-09-02 DIAGNOSIS — M9901 Segmental and somatic dysfunction of cervical region: Secondary | ICD-10-CM | POA: Diagnosis not present

## 2022-09-17 DIAGNOSIS — M6281 Muscle weakness (generalized): Secondary | ICD-10-CM | POA: Diagnosis not present

## 2022-09-17 DIAGNOSIS — M545 Low back pain, unspecified: Secondary | ICD-10-CM | POA: Diagnosis not present

## 2022-09-18 DIAGNOSIS — M9901 Segmental and somatic dysfunction of cervical region: Secondary | ICD-10-CM | POA: Diagnosis not present

## 2022-09-18 DIAGNOSIS — S161XXA Strain of muscle, fascia and tendon at neck level, initial encounter: Secondary | ICD-10-CM | POA: Diagnosis not present

## 2022-09-18 DIAGNOSIS — R42 Dizziness and giddiness: Secondary | ICD-10-CM | POA: Diagnosis not present

## 2022-09-21 ENCOUNTER — Emergency Department (HOSPITAL_COMMUNITY)
Admission: EM | Admit: 2022-09-21 | Discharge: 2022-09-21 | Disposition: A | Discharge status: Home / Self Care | Payer: Medicare HMO | Attending: Emergency Medicine | Admitting: Emergency Medicine

## 2022-09-21 ENCOUNTER — Emergency Department (HOSPITAL_COMMUNITY): Payer: Medicare HMO

## 2022-09-21 ENCOUNTER — Encounter (HOSPITAL_COMMUNITY): Payer: Self-pay | Admitting: Emergency Medicine

## 2022-09-21 ENCOUNTER — Other Ambulatory Visit: Payer: Self-pay

## 2022-09-21 DIAGNOSIS — M5136 Other intervertebral disc degeneration, lumbar region: Secondary | ICD-10-CM | POA: Diagnosis not present

## 2022-09-21 DIAGNOSIS — S39012A Strain of muscle, fascia and tendon of lower back, initial encounter: Secondary | ICD-10-CM | POA: Diagnosis not present

## 2022-09-21 DIAGNOSIS — W1830XA Fall on same level, unspecified, initial encounter: Secondary | ICD-10-CM | POA: Diagnosis not present

## 2022-09-21 DIAGNOSIS — M545 Low back pain, unspecified: Secondary | ICD-10-CM | POA: Diagnosis not present

## 2022-09-21 DIAGNOSIS — M47816 Spondylosis without myelopathy or radiculopathy, lumbar region: Secondary | ICD-10-CM | POA: Diagnosis not present

## 2022-09-21 HISTORY — DX: Age-related osteoporosis without current pathological fracture: M81.0

## 2022-09-21 MED ORDER — NAPROXEN 375 MG PO TABS: 375.0000 mg | ORAL_TABLET | Freq: Two times a day (BID) | ORAL | 0 refills | Status: AC

## 2022-09-21 MED ORDER — CYCLOBENZAPRINE HCL 5 MG PO TABS: 5.0000 mg | ORAL_TABLET | Freq: Three times a day (TID) | ORAL | 0 refills | Status: AC | PRN

## 2022-09-21 MED ORDER — KETOROLAC TROMETHAMINE 30 MG/ML IJ SOLN
15.0000 mg | Freq: Once | INTRAMUSCULAR | Status: AC
  Administered 2022-09-21: 15 mg via INTRAMUSCULAR
  Filled 2022-09-21: qty 1

## 2022-09-21 MED ORDER — CYCLOBENZAPRINE HCL 10 MG PO TABS
5.0000 mg | ORAL_TABLET | Freq: Once | ORAL | Status: AC
  Administered 2022-09-21: 5 mg via ORAL
  Filled 2022-09-21: qty 1

## 2022-09-21 NOTE — ED Provider Notes (Signed)
[place] [facility]MENT AT [facility]  Provider Note  CSN: [id number] Arrival date & time: 09/21/22 0148  History Chief Complaint  Patient presents with   Back Pain    Julia [name] is a 72 y.o. female here with her nephew for evaluation of low back pain. Started about a week ago after helping her husband up off the floor after he fell. She has had intermittent episodes of spasm pain in lower back since then. Worse with movement. Improved some with motrin at home, but worsened in the last 2 days and unable to sleep tonight due to pain, even while rest in bed. No incontinence. No pain or numbness into legs.    Home Medications Prior to Admission medications   Medication Sig Start Date End Date Taking? Authorizing Provider  cyclobenzaprine (FLEXERIL) 5 MG tablet Take 1 tablet (5 mg total) by mouth 3 (three) times daily as needed for muscle spasms. 09/21/22  Yes Pollyann Savoy, MD  naproxen (NAPROSYN) 375 MG tablet Take 1 tablet (375 mg total) by mouth 2 (two) times daily. 09/21/22  Yes Pollyann Savoy, MD  Biotin 5000 MCG TABS Take by mouth. Take 1 tablet by mouth daily    [provider]  carvedilol (COREG) 6.25 MG tablet Take 1 tablet by mouth twice daily 05/27/22   Little Ishikawa, MD  Cholecalciferol (VITAMIN D-3) 5000 UNITS TABS Take 1 tablet by mouth daily.    [provider]  co-enzyme Q-10 30 MG capsule Take 30 mg by mouth daily.    [provider]  CYANOCOBALAMIN IJ Inject 100 mg as directed once a week. Usually on Wednesday    [provider]  fexofenadine (ALLEGRA) 180 MG tablet Take 180 mg by mouth daily.    [provider]  meclizine (ANTIVERT) 25 MG tablet Take 25 mg by mouth 3 (three) times daily as needed for dizziness. Take 1 tablet every 6 hours as needed for dizziness.    [provider]  Menaquinone-7 (VITAMIN K2 PO) Take by mouth.    [provider]  Multiple Vitamin  (MULTIVITAMIN WITH MINERALS) TABS Take 1 tablet by mouth daily.    [provider]  cetirizine (ZYRTEC) 10 MG tablet Take 10 mg by mouth daily.  05/23/20  [provider]  medroxyPROGESTERone (PROVERA) 2.5 MG tablet Take 2.5 mg by mouth daily.  05/23/20  [provider]  omeprazole (PRILOSEC) 20 MG capsule Take 20 mg by mouth daily.  05/23/20  [provider]  thyroid (ARMOUR) 30 MG tablet Take 30 mg by mouth daily.  05/23/20  [provider]     Allergies    Elemental sulfur and Sulfa antibiotics   Review of Systems   Review of Systems Please see HPI for pertinent positives and negatives  Physical Exam BP (!) 173/87 (BP Location: Right Arm)   Pulse 86   Temp 98.2 F (36.8 C) (Oral)   Resp 18   Ht 5\' 8"  (1.727 m)   Wt 72.6 kg   SpO2 97%   BMI 24.33 kg/m   Physical Exam Vitals and nursing note reviewed.  Constitutional:      Appearance: Normal appearance.  HENT:     Head: Normocephalic and atraumatic.     Nose: Nose normal.     Mouth/Throat:     Mouth: Mucous membranes are moist.  Eyes:     Extraocular Movements: Extraocular movements intact.     Conjunctiva/sclera: Conjunctivae normal.  Cardiovascular:  Rate and Rhythm: Normal rate.  Pulmonary:     Effort: Pulmonary effort is normal.     Breath sounds: Normal breath sounds.  Abdominal:     General: Abdomen is flat.     Palpations: Abdomen is soft.     Tenderness: There is no abdominal tenderness.  Musculoskeletal:        General: Tenderness (midline lower lumbar spine and bilateral paraspinal muscles R>L) present. No swelling. Normal range of motion.     Cervical back: Neck supple.  Skin:    General: Skin is warm and dry.  Neurological:     General: No focal deficit present.     Mental Status: She is alert.     Sensory: No sensory deficit.     Motor: No weakness.  Psychiatric:        Mood and Affect: Mood normal.     ED Results / Procedures / Treatments    EKG None  Procedures Procedures  Medications Ordered in the ED Medications  ketorolac (TORADOL) 30 MG/ML injection 15 mg (15 mg Intramuscular Given 09/21/22 0337)  cyclobenzaprine (FLEXERIL) tablet 5 mg (5 mg Oral Given 09/21/22 0332)    Initial Impression and Plan  Patient here with low back pain after helping her husband off the floor about a week ago. Some midline pain, will check xray for signs of compression. Toradol and flexeril for pain.   ED Course   Clinical Course as of 09/21/22 0453  [facility] Sep 21, 2022  8295 I personally viewed the images from radiology studies and agree with radiologist interpretation:  Xray negative for acute fracture. Patient reports pain improved and she is ready to go home. Plan discharge with Rx for naprosyn, flexeril, recommend rest, heat and PCP follow up, RTED for any other concerns.   [CS]    Clinical Course User Index [CS] Pollyann Savoy, MD     MDM Rules/Calculators/A&P Medical Decision Making Problems Addressed: Strain of lumbar region, initial encounter: acute illness or injury  Amount and/or Complexity of Data Reviewed Radiology: ordered and independent interpretation performed. Decision-making details documented in ED Course.  Risk Prescription drug management.     Final Clinical Impression(s) / ED Diagnoses Final diagnoses:  Strain of lumbar region, initial encounter    Rx / DC Orders ED Discharge Orders          Ordered    naproxen (NAPROSYN) 375 MG tablet  2 times daily        09/21/22 0453    cyclobenzaprine (FLEXERIL) 5 MG tablet  3 times daily PRN        09/21/22 0453             Pollyann Savoy, MD 09/21/22 [phone]

## 2022-09-21 NOTE — ED Triage Notes (Signed)
Pt here for c/o lower back pain and back spasms that started after she helped her husband get into the bed after a fall.

## 2022-09-21 NOTE — ED Notes (Signed)
Patient transported to X-ray 

## 2022-09-22 DIAGNOSIS — M6281 Muscle weakness (generalized): Secondary | ICD-10-CM | POA: Diagnosis not present

## 2022-09-22 DIAGNOSIS — M545 Low back pain, unspecified: Secondary | ICD-10-CM | POA: Diagnosis not present

## 2022-09-24 DIAGNOSIS — M545 Low back pain, unspecified: Secondary | ICD-10-CM | POA: Diagnosis not present

## 2022-10-02 DIAGNOSIS — M545 Low back pain, unspecified: Secondary | ICD-10-CM | POA: Diagnosis not present

## 2022-10-05 DIAGNOSIS — M9902 Segmental and somatic dysfunction of thoracic region: Secondary | ICD-10-CM | POA: Diagnosis not present

## 2022-10-05 DIAGNOSIS — M25552 Pain in left hip: Secondary | ICD-10-CM | POA: Diagnosis not present

## 2022-10-05 DIAGNOSIS — M9903 Segmental and somatic dysfunction of lumbar region: Secondary | ICD-10-CM | POA: Diagnosis not present

## 2022-10-05 DIAGNOSIS — M62830 Muscle spasm of back: Secondary | ICD-10-CM | POA: Diagnosis not present

## 2022-10-05 DIAGNOSIS — M9905 Segmental and somatic dysfunction of pelvic region: Secondary | ICD-10-CM | POA: Diagnosis not present

## 2022-10-07 DIAGNOSIS — M9902 Segmental and somatic dysfunction of thoracic region: Secondary | ICD-10-CM | POA: Diagnosis not present

## 2022-10-07 DIAGNOSIS — M9903 Segmental and somatic dysfunction of lumbar region: Secondary | ICD-10-CM | POA: Diagnosis not present

## 2022-10-07 DIAGNOSIS — M62830 Muscle spasm of back: Secondary | ICD-10-CM | POA: Diagnosis not present

## 2022-10-07 DIAGNOSIS — M9905 Segmental and somatic dysfunction of pelvic region: Secondary | ICD-10-CM | POA: Diagnosis not present

## 2022-10-07 DIAGNOSIS — M25552 Pain in left hip: Secondary | ICD-10-CM | POA: Diagnosis not present

## 2022-10-12 DIAGNOSIS — M62830 Muscle spasm of back: Secondary | ICD-10-CM | POA: Diagnosis not present

## 2022-10-12 DIAGNOSIS — M9905 Segmental and somatic dysfunction of pelvic region: Secondary | ICD-10-CM | POA: Diagnosis not present

## 2022-10-12 DIAGNOSIS — M9903 Segmental and somatic dysfunction of lumbar region: Secondary | ICD-10-CM | POA: Diagnosis not present

## 2022-10-12 DIAGNOSIS — M9902 Segmental and somatic dysfunction of thoracic region: Secondary | ICD-10-CM | POA: Diagnosis not present

## 2022-10-12 DIAGNOSIS — M25552 Pain in left hip: Secondary | ICD-10-CM | POA: Diagnosis not present

## 2022-10-14 DIAGNOSIS — M9903 Segmental and somatic dysfunction of lumbar region: Secondary | ICD-10-CM | POA: Diagnosis not present

## 2022-10-14 DIAGNOSIS — M25552 Pain in left hip: Secondary | ICD-10-CM | POA: Diagnosis not present

## 2022-10-14 DIAGNOSIS — M9905 Segmental and somatic dysfunction of pelvic region: Secondary | ICD-10-CM | POA: Diagnosis not present

## 2022-10-14 DIAGNOSIS — M9902 Segmental and somatic dysfunction of thoracic region: Secondary | ICD-10-CM | POA: Diagnosis not present

## 2022-10-14 DIAGNOSIS — M62830 Muscle spasm of back: Secondary | ICD-10-CM | POA: Diagnosis not present

## 2022-10-19 DIAGNOSIS — M62830 Muscle spasm of back: Secondary | ICD-10-CM | POA: Diagnosis not present

## 2022-10-19 DIAGNOSIS — M9902 Segmental and somatic dysfunction of thoracic region: Secondary | ICD-10-CM | POA: Diagnosis not present

## 2022-10-19 DIAGNOSIS — M9905 Segmental and somatic dysfunction of pelvic region: Secondary | ICD-10-CM | POA: Diagnosis not present

## 2022-10-19 DIAGNOSIS — M25552 Pain in left hip: Secondary | ICD-10-CM | POA: Diagnosis not present

## 2022-10-19 DIAGNOSIS — M9903 Segmental and somatic dysfunction of lumbar region: Secondary | ICD-10-CM | POA: Diagnosis not present

## 2022-10-28 DIAGNOSIS — R159 Full incontinence of feces: Secondary | ICD-10-CM | POA: Diagnosis not present

## 2022-10-28 DIAGNOSIS — Z860101 Personal history of adenomatous and serrated colon polyps: Secondary | ICD-10-CM | POA: Diagnosis not present

## 2022-10-28 DIAGNOSIS — R195 Other fecal abnormalities: Secondary | ICD-10-CM | POA: Diagnosis not present

## 2022-12-01 DIAGNOSIS — F321 Major depressive disorder, single episode, moderate: Secondary | ICD-10-CM | POA: Diagnosis not present

## 2022-12-01 DIAGNOSIS — F411 Generalized anxiety disorder: Secondary | ICD-10-CM | POA: Diagnosis not present

## 2022-12-16 DIAGNOSIS — F329 Major depressive disorder, single episode, unspecified: Secondary | ICD-10-CM | POA: Diagnosis not present

## 2023-01-08 DIAGNOSIS — F320 Major depressive disorder, single episode, mild: Secondary | ICD-10-CM | POA: Diagnosis not present

## 2023-01-15 DIAGNOSIS — F320 Major depressive disorder, single episode, mild: Secondary | ICD-10-CM | POA: Diagnosis not present

## 2023-01-27 DIAGNOSIS — F320 Major depressive disorder, single episode, mild: Secondary | ICD-10-CM | POA: Diagnosis not present

## 2023-01-27 DIAGNOSIS — F411 Generalized anxiety disorder: Secondary | ICD-10-CM | POA: Diagnosis not present

## 2023-02-03 DIAGNOSIS — E559 Vitamin D deficiency, unspecified: Secondary | ICD-10-CM | POA: Diagnosis not present

## 2023-02-03 DIAGNOSIS — E782 Mixed hyperlipidemia: Secondary | ICD-10-CM | POA: Diagnosis not present

## 2023-02-03 DIAGNOSIS — R7303 Prediabetes: Secondary | ICD-10-CM | POA: Diagnosis not present

## 2023-02-04 DIAGNOSIS — F320 Major depressive disorder, single episode, mild: Secondary | ICD-10-CM | POA: Diagnosis not present

## 2023-02-04 DIAGNOSIS — F411 Generalized anxiety disorder: Secondary | ICD-10-CM | POA: Diagnosis not present

## 2023-02-05 LAB — LAB REPORT - SCANNED
A1c: 5.9
Albumin, Urine POC: 6.4
Creatinine, POC: 65.4 mg/dL
EGFR: 88
Microalb Creat Ratio: 10

## 2023-02-11 DIAGNOSIS — Z1283 Encounter for screening for malignant neoplasm of skin: Secondary | ICD-10-CM | POA: Diagnosis not present

## 2023-02-11 DIAGNOSIS — D225 Melanocytic nevi of trunk: Secondary | ICD-10-CM | POA: Diagnosis not present

## 2023-02-11 DIAGNOSIS — L820 Inflamed seborrheic keratosis: Secondary | ICD-10-CM | POA: Diagnosis not present

## 2023-02-18 DIAGNOSIS — F320 Major depressive disorder, single episode, mild: Secondary | ICD-10-CM | POA: Diagnosis not present

## 2023-02-18 DIAGNOSIS — F411 Generalized anxiety disorder: Secondary | ICD-10-CM | POA: Diagnosis not present

## 2023-03-02 DIAGNOSIS — M545 Low back pain, unspecified: Secondary | ICD-10-CM | POA: Diagnosis not present

## 2023-03-02 DIAGNOSIS — D510 Vitamin B12 deficiency anemia due to intrinsic factor deficiency: Secondary | ICD-10-CM | POA: Diagnosis not present

## 2023-03-02 DIAGNOSIS — R002 Palpitations: Secondary | ICD-10-CM | POA: Diagnosis not present

## 2023-03-02 DIAGNOSIS — R42 Dizziness and giddiness: Secondary | ICD-10-CM | POA: Diagnosis not present

## 2023-03-02 DIAGNOSIS — C7A090 Malignant carcinoid tumor of the bronchus and lung: Secondary | ICD-10-CM | POA: Diagnosis not present

## 2023-03-02 DIAGNOSIS — F411 Generalized anxiety disorder: Secondary | ICD-10-CM | POA: Diagnosis not present

## 2023-03-02 DIAGNOSIS — I10 Essential (primary) hypertension: Secondary | ICD-10-CM | POA: Diagnosis not present

## 2023-03-02 DIAGNOSIS — Z0001 Encounter for general adult medical examination with abnormal findings: Secondary | ICD-10-CM | POA: Diagnosis not present

## 2023-03-02 DIAGNOSIS — M1990 Unspecified osteoarthritis, unspecified site: Secondary | ICD-10-CM | POA: Diagnosis not present

## 2023-03-02 DIAGNOSIS — R7303 Prediabetes: Secondary | ICD-10-CM | POA: Diagnosis not present

## 2023-03-02 DIAGNOSIS — Z8616 Personal history of COVID-19: Secondary | ICD-10-CM | POA: Diagnosis not present

## 2023-03-02 DIAGNOSIS — E782 Mixed hyperlipidemia: Secondary | ICD-10-CM | POA: Diagnosis not present

## 2023-04-02 DIAGNOSIS — F411 Generalized anxiety disorder: Secondary | ICD-10-CM | POA: Diagnosis not present

## 2023-04-02 DIAGNOSIS — F3341 Major depressive disorder, recurrent, in partial remission: Secondary | ICD-10-CM | POA: Diagnosis not present

## 2023-04-13 DIAGNOSIS — H43813 Vitreous degeneration, bilateral: Secondary | ICD-10-CM | POA: Diagnosis not present

## 2023-04-14 DIAGNOSIS — F411 Generalized anxiety disorder: Secondary | ICD-10-CM | POA: Diagnosis not present

## 2023-04-14 DIAGNOSIS — F330 Major depressive disorder, recurrent, mild: Secondary | ICD-10-CM | POA: Diagnosis not present

## 2023-04-27 DIAGNOSIS — Z1231 Encounter for screening mammogram for malignant neoplasm of breast: Secondary | ICD-10-CM | POA: Diagnosis not present

## 2023-05-06 DIAGNOSIS — F3341 Major depressive disorder, recurrent, in partial remission: Secondary | ICD-10-CM | POA: Diagnosis not present

## 2023-05-06 DIAGNOSIS — F411 Generalized anxiety disorder: Secondary | ICD-10-CM | POA: Diagnosis not present

## 2023-05-27 DIAGNOSIS — F3340 Major depressive disorder, recurrent, in remission, unspecified: Secondary | ICD-10-CM | POA: Diagnosis not present

## 2023-05-27 DIAGNOSIS — F411 Generalized anxiety disorder: Secondary | ICD-10-CM | POA: Diagnosis not present

## 2023-06-30 DIAGNOSIS — F411 Generalized anxiety disorder: Secondary | ICD-10-CM | POA: Diagnosis not present

## 2023-07-06 DIAGNOSIS — D3A090 Benign carcinoid tumor of the bronchus and lung: Secondary | ICD-10-CM | POA: Diagnosis not present

## 2023-07-06 DIAGNOSIS — R911 Solitary pulmonary nodule: Secondary | ICD-10-CM | POA: Diagnosis not present

## 2023-07-20 DIAGNOSIS — Z124 Encounter for screening for malignant neoplasm of cervix: Secondary | ICD-10-CM | POA: Diagnosis not present

## 2023-07-20 DIAGNOSIS — Z01419 Encounter for gynecological examination (general) (routine) without abnormal findings: Secondary | ICD-10-CM | POA: Diagnosis not present

## 2023-07-23 DIAGNOSIS — F411 Generalized anxiety disorder: Secondary | ICD-10-CM | POA: Diagnosis not present

## 2023-08-24 DIAGNOSIS — E782 Mixed hyperlipidemia: Secondary | ICD-10-CM | POA: Diagnosis not present

## 2023-08-24 DIAGNOSIS — E559 Vitamin D deficiency, unspecified: Secondary | ICD-10-CM | POA: Diagnosis not present

## 2023-08-24 DIAGNOSIS — R7303 Prediabetes: Secondary | ICD-10-CM | POA: Diagnosis not present

## 2023-08-25 LAB — LAB REPORT - SCANNED
A1c: 5.6
Albumin, Urine POC: 10.6
Creatinine, POC: 49.4 mg/dL
Microalb Creat Ratio: 21

## 2023-08-30 DIAGNOSIS — E782 Mixed hyperlipidemia: Secondary | ICD-10-CM | POA: Diagnosis not present

## 2023-08-30 DIAGNOSIS — G4733 Obstructive sleep apnea (adult) (pediatric): Secondary | ICD-10-CM | POA: Diagnosis not present

## 2023-08-30 DIAGNOSIS — M545 Low back pain, unspecified: Secondary | ICD-10-CM | POA: Diagnosis not present

## 2023-08-30 DIAGNOSIS — R002 Palpitations: Secondary | ICD-10-CM | POA: Diagnosis not present

## 2023-08-30 DIAGNOSIS — R7303 Prediabetes: Secondary | ICD-10-CM | POA: Diagnosis not present

## 2023-08-30 DIAGNOSIS — C7A090 Malignant carcinoid tumor of the bronchus and lung: Secondary | ICD-10-CM | POA: Diagnosis not present

## 2023-08-30 DIAGNOSIS — D510 Vitamin B12 deficiency anemia due to intrinsic factor deficiency: Secondary | ICD-10-CM | POA: Diagnosis not present

## 2023-08-30 DIAGNOSIS — M810 Age-related osteoporosis without current pathological fracture: Secondary | ICD-10-CM | POA: Diagnosis not present

## 2023-08-30 DIAGNOSIS — E559 Vitamin D deficiency, unspecified: Secondary | ICD-10-CM | POA: Diagnosis not present

## 2023-08-30 DIAGNOSIS — M1990 Unspecified osteoarthritis, unspecified site: Secondary | ICD-10-CM | POA: Diagnosis not present

## 2023-08-30 DIAGNOSIS — I10 Essential (primary) hypertension: Secondary | ICD-10-CM | POA: Diagnosis not present

## 2023-08-30 DIAGNOSIS — F411 Generalized anxiety disorder: Secondary | ICD-10-CM | POA: Diagnosis not present

## 2023-09-23 DIAGNOSIS — R151 Fecal smearing: Secondary | ICD-10-CM | POA: Diagnosis not present

## 2023-09-23 DIAGNOSIS — R195 Other fecal abnormalities: Secondary | ICD-10-CM | POA: Diagnosis not present

## 2023-09-27 ENCOUNTER — Ambulatory Visit: Admitting: Nutrition

## 2023-10-04 ENCOUNTER — Encounter: Payer: Self-pay | Admitting: Nutrition

## 2023-10-04 ENCOUNTER — Encounter: Attending: Internal Medicine | Admitting: Nutrition

## 2023-10-04 DIAGNOSIS — R7303 Prediabetes: Secondary | ICD-10-CM | POA: Insufficient documentation

## 2023-10-04 DIAGNOSIS — E782 Mixed hyperlipidemia: Secondary | ICD-10-CM | POA: Insufficient documentation

## 2023-10-04 NOTE — Patient Instructions (Addendum)
 Goals  Focus  on whole plant based foods [employer] over [employer] and get cookbook from [employer] program Avoid cook out and other processed places Choose veggies when snacking  Find hobbies for night time Get/keep A1C below 5.7% Get LDL Down to 100 or less Get in 25 grams of fiber per day

## 2023-10-04 NOTE — Progress Notes (Signed)
 Medical Nutrition Therapy  Appointment Start time:  0800  Appointment End time:  0930  Primary concerns today: Pre diabetes  Referral diagnosis: R73.03 Preferred learning style: No preference  Learning readiness: ready    NUTRITION ASSESSMENT  73 yr old wfemale referred for prediabetes. A1C 5.6% now but was 5.9%. PMH: Hyperlipidemia. [id number] mg/dl. Has a lot of stress with her husband who has Parkinson's and Dementia. He is in a assisted living facility and doing much better.  She walks some for exercise. Usually eats most meals at home. Goes out socially at times. She wants to improve and reverse her pre diabetes, hyperlipidemia and other health issues with Lifestyle Medicine. She wants to focus on more whole plant based foods for needed fiber, plant protein and nutrient dense foods for health.  Clinical Medical Hx:  Past Medical History:  Diagnosis Date   Anemia    Atrial septal aneurysm    Incidental finding, echo, 2008   Chest pain    Myoview  in the past, no definite ischemia,, possible inferior scar   //   cardiac catheterization July 19, 2007,  no significant coronary disease,  EF 55-60%   Ejection fraction    EF 50-55%, echo, February, 2008,  no Munyan motion abnormalities     EF 55-60%, catheterization, 2009   GERD (gastroesophageal reflux disease)    Hypertension    Osteoporosis    Palpitations    Monitor in 2000 showed sinus rhythm and sinus tachycardia. There were occasional supraventricular beats. She was treated with beta blocker    Medications:  Current Outpatient Medications on File Prior to Visit  Medication Sig Dispense Refill   Biotin 5000 MCG TABS Take by mouth. Take 1 tablet by mouth daily     carvedilol  (COREG ) 6.25 MG tablet Take 1 tablet by mouth twice daily 180 tablet 1   Cholecalciferol (VITAMIN D-3) 5000 UNITS TABS Take 1 tablet by mouth daily.     co-enzyme Q-10 30 MG capsule Take 30 mg by mouth daily.     CYANOCOBALAMIN  IJ Inject 100 mg as directed  once a week. Usually on Wednesday     cyclobenzaprine  (FLEXERIL ) 5 MG tablet Take 1 tablet (5 mg total) by mouth 3 (three) times daily as needed for muscle spasms. 20 tablet 0   fexofenadine (ALLEGRA) 180 MG tablet Take 180 mg by mouth daily.     meclizine (ANTIVERT) 25 MG tablet Take 25 mg by mouth 3 (three) times daily as needed for dizziness. Take 1 tablet every 6 hours as needed for dizziness.     Menaquinone-7 (VITAMIN K2 PO) Take by mouth.     Multiple Vitamin (MULTIVITAMIN WITH MINERALS) TABS Take 1 tablet by mouth daily.     naproxen  (NAPROSYN ) 375 MG tablet Take 1 tablet (375 mg total) by mouth 2 (two) times daily. 20 tablet 0   [DISCONTINUED] cetirizine (ZYRTEC) 10 MG tablet Take 10 mg by mouth daily.     [DISCONTINUED] medroxyPROGESTERone (PROVERA) 2.5 MG tablet Take 2.5 mg by mouth daily.     [DISCONTINUED] omeprazole (PRILOSEC) 20 MG capsule Take 20 mg by mouth daily.     [DISCONTINUED] thyroid  (ARMOUR) 30 MG tablet Take 30 mg by mouth daily.     No current facility-administered medications on file prior to visit.   Labs: Pre Dm A1C 5,6% now, had been up to 5.9%. LDL high at 158 mg/dl.  Notable Signs/Symptoms: none  Lifestyle & Dietary Hx Married. Spouse is in a nursing care facility Eats  3 meals per day [facility] for exercise.  Estimated daily fluid intake: 80 oz Supplements: B12, D3, Omega 3, Biotin, Coq10, Vit K2 MK, Viactiv Calcium, Mg, Sleep: good Stress / self-care: yes Current average weekly physical activity: walks most days  24-Hr Dietary Recall First Meal: 1 slice toast whole wheat, pear preserves 1 tsp, cottage cheese and nuts and coffee. Snack:  Second Meal: Cookout BBQ plates, slaw, hushpuppies. Snack:  Third Meal: Leftover from lunch Snack:  Beverages: water  Estimated Energy Needs Calories: 1500 Carbohydrate: 170g Protein: 112g Fat: 42g   NUTRITION DIAGNOSIS  NB-1.1 Food and nutrition-related knowledge deficit As related to Inconsistent or  excessive CHO and fat diet.  As evidenced by A1C 5.9% and LDL 158.   NUTRITION INTERVENTION  Nutrition education (E-1) on the following topics:  Nutrition and Diabetes education provided on My Plate, CHO counting, meal planning, portion sizes, timing of meals, avoiding snacks between meals unless having a low blood sugar, target ranges for A1C and blood sugars, signs/symptoms and treatment of hyper/hypoglycemia, monitoring blood sugars, taking medications as prescribed, benefits of exercising 30 minutes per day and prevention of complications of DM.  Lifestyle Medicine  - Whole Food, Plant Predominant Nutrition is highly recommended: Eat Plenty of vegetables, Mushrooms, fruits, Legumes, Whole Grains, Nuts, seeds in lieu of processed meats, processed snacks/pastries red meat, poultry, eggs.    -It is better to avoid simple carbohydrates including: Cakes, Sweet Desserts, Ice Cream, Soda (diet and regular), Sweet Tea, Candies, Chips, Cookies, Store Bought Juices, Alcohol in Excess of  1-2 drinks a day, Lemonade,  Artificial Sweeteners, Doughnuts, Coffee Creamers, Sugar-free Products, etc, etc.  This is not a complete list.....  Exercise: If you are able: 30 -60 minutes a day ,4 days a week, or 150 minutes a week.  The longer the better.  Combine stretch, strength, and aerobic activities.  If you were told in the past that you have high risk for cardiovascular diseases, you may seek evaluation by your heart doctor prior to initiating moderate to intense exercise programs.   Handouts Provided Include  LIfestyle Medicine  Learning Style & Readiness for Change Teaching method utilized: Visual & Auditory  Demonstrated degree of understanding via: Teach Back  Barriers to learning/adherence to lifestyle change: none  Goals Established by Pt Goals  Focus  on whole plant based foods [employer] over [employer] and get cookbook from [employer] program Avoid cook out and  other processed places Choose veggies when snacking  Find hobbies for night time Get/keep A1C below 5.7% Get LDL Down to 100 or less Get in 25 grams of fiber per day   MONITORING & EVALUATION Dietary intake, weekly physical activity, and labs in 2 months.  Next Steps  Patient is to work on following a more whole plant based lifestyle.SABRA

## 2023-10-28 ENCOUNTER — Ambulatory Visit

## 2023-10-28 ENCOUNTER — Ambulatory Visit
Admission: RE | Admit: 2023-10-28 | Discharge: 2023-10-28 | Disposition: A | Discharge status: Home / Self Care | Attending: Family Medicine | Admitting: Family Medicine

## 2023-10-28 VITALS — BP 145/88 | HR 77 | Temp 97.8°F | Resp 18

## 2023-10-28 DIAGNOSIS — M79641 Pain in right hand: Secondary | ICD-10-CM

## 2023-10-28 DIAGNOSIS — S6991XA Unspecified injury of right wrist, hand and finger(s), initial encounter: Secondary | ICD-10-CM | POA: Diagnosis not present

## 2023-10-28 DIAGNOSIS — W19XXXA Unspecified fall, initial encounter: Secondary | ICD-10-CM | POA: Diagnosis not present

## 2023-10-28 NOTE — Discharge Instructions (Signed)
 Rest, ice, compression, elevation, over-the-counter pain relievers as needed.  Your x-ray was negative for any broken bones today which is great news.

## 2023-10-28 NOTE — ED Triage Notes (Addendum)
 Pt reports right hand pain after fall yesterday during the trail walker where she tripped over a root. Denies head injury during the fall.

## 2023-10-28 NOTE — ED Provider Notes (Signed)
 [facility]    CSN: [id number] Arrival date & time: 10/28/23  1018      History   Chief Complaint Chief Complaint  Patient presents with   Hand Problem    Fell during trail walk yesterday. - Entered by patient    HPI Julia [name] is a 73 y.o. female.   Patient presenting today with right hand pain, abrasion after a fall that occurred yesterday when she tripped on a root.  She denies loss of range of motion, numbness, tingling, uncontrolled bleeding, significant bruising or swelling.  So far trying a wrist brace and ice with mild temporary benefit.    Past Medical History:  Diagnosis Date   Anemia    Atrial septal aneurysm    Incidental finding, echo, 2008   Chest pain    Myoview  in the past, no definite ischemia,, possible inferior scar   //   cardiac catheterization July 19, 2007,  no significant coronary disease,  EF 55-60%   Ejection fraction    EF 50-55%, echo, February, 2008,  no Luber motion abnormalities     EF 55-60%, catheterization, 2009   GERD (gastroesophageal reflux disease)    Hypertension    Osteoporosis    Palpitations    Monitor in 2000 showed sinus rhythm and sinus tachycardia. There were occasional supraventricular beats. 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   Vertigo 01/16/2021   Vitamin B12 deficiency (non anemic) 01/16/2021   Peripheral vascular disease, unspecified 05/25/2012   Palpitations    GERD (gastroesophageal reflux disease)    Chest pain    Ejection fraction    Atrial septal aneurysm    VITAMIN D DEFICIENCY 07/13/2008   ANEMIA, PERNICIOUS 07/13/2008   HYPERTENSION, UNSPECIFIED 07/13/2008   HYPERLIPIDEMIA  08/01/2007   ANXIETY 08/01/2007   GERD 08/01/2007   OSTEOPENIA 08/01/2007   EPIGASTRIC PAIN 08/01/2007   FRACTURE, PATELLA 02/21/2007   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      Home Medications    Prior to Admission medications   Medication Sig Start Date End Date Taking? Authorizing Provider  Biotin 5000 MCG TABS Take by mouth. Take 1 tablet by mouth daily    [provider]  carvedilol  (COREG ) 6.25 MG tablet Take 1 tablet by mouth twice daily 05/27/22   Kate Lonni CROME, MD  Cholecalciferol (VITAMIN D-3) 5000 UNITS TABS Take 1 tablet by mouth daily.    [provider]  co-enzyme Q-10 30 MG capsule Take 30 mg by mouth daily.    [provider]  CYANOCOBALAMIN  IJ Inject 100 mg as directed once a week. Usually on Wednesday    [provider]  cyclobenzaprine  (FLEXERIL ) 5 MG tablet Take 1 tablet (5 mg total) by mouth 3 (three) times daily as needed for muscle spasms. 09/21/22   Roselyn Carlin NOVAK, MD  fexofenadine (ALLEGRA) 180 MG tablet Take 180 mg by mouth daily.    [provider]  meclizine (ANTIVERT) 25 MG tablet Take 25 mg by mouth 3 (three) times daily as needed for dizziness. Take 1 tablet every 6 hours as needed for dizziness.    [provider]  Menaquinone-7 (VITAMIN K2 PO) Take by mouth.    [provider]  Multiple Vitamin (MULTIVITAMIN WITH MINERALS) TABS Take 1 tablet by mouth daily.    [provider]  naproxen  (NAPROSYN ) 375 MG tablet Take 1 tablet (375 mg total) by mouth 2 (two) times daily. 09/21/22   Roselyn Carlin NOVAK, MD  cetirizine (ZYRTEC) 10 MG tablet Take 10 mg by mouth daily.  05/23/20  [provider]  medroxyPROGESTERone (PROVERA) 2.5 MG tablet Take 2.5 mg  by mouth daily.  05/23/20  [provider]  omeprazole (PRILOSEC) 20 MG capsule Take 20 mg by mouth daily.  05/23/20  [provider]  thyroid  (ARMOUR) 30 MG tablet Take 30 mg by mouth daily.  05/23/20  [provider]    Family History Family History  Problem Relation Age of Onset   Heart attack Mother    Heart attack Father     Social History Social History   Tobacco Use   Smoking status: Never   Smokeless tobacco: Never  Substance Use Topics   Alcohol use: No     Allergies   Elemental sulfur and Sulfa antibiotics   Review of Systems Review of Systems Per HPI  Physical Exam Triage Vital Signs ED Triage Vitals  Encounter Vitals Group     BP 10/28/23 1024 (!) 145/88     Girls Systolic BP Percentile --      Girls Diastolic BP Percentile --      Boys Systolic BP Percentile --      Boys Diastolic BP Percentile --      Pulse Rate 10/28/23 1024 77     Resp 10/28/23 1024 18     Temp 10/28/23 1024 97.8 F (36.6 C)     Temp Source 10/28/23 1024 Oral     SpO2 10/28/23 1024 95 %     Weight --      Height --      Head Circumference --      Peak Flow --      Pain Score 10/28/23 1027 2     Pain Loc --      Pain Education --      Exclude from Growth Chart --    No data found.  Updated Vital Signs BP (!) 145/88 (BP Location: Right Arm)   Pulse 77   Temp 97.8 F (36.6 C) (Oral)   Resp 18   SpO2 95%   Visual Acuity Right Eye Distance:   Left Eye Distance:   Bilateral Distance:    Right Eye Near:   Left Eye Near:    Bilateral Near:     Physical Exam Vitals and nursing note reviewed.  Constitutional:      Appearance: Normal appearance. She is not ill-appearing.  HENT:     Head: Atraumatic.  Eyes:     Extraocular Movements: Extraocular movements intact.     Conjunctiva/sclera: Conjunctivae normal.  Cardiovascular:     Rate and Rhythm: Normal rate.  Pulmonary:     Effort: Pulmonary effort is normal.  Musculoskeletal:         General: Tenderness and signs of injury present. No swelling or deformity. Normal range of motion.     Cervical back: Normal range of motion and neck supple.     Comments: Tenderness  to palpation to the right hand centrally with no bony deformity palpable, range of motion intact  Skin:    General: Skin is warm and dry.     Findings: No bruising or erythema.     Comments: Slight superficial abrasion to the thenar aspect of the right hand  Neurological:     Mental Status: She is alert and oriented to person, place, and time.     Comments: Right hand neurovascularly intact  Psychiatric:        Mood and Affect: Mood normal.        Thought Content: Thought content normal.        Judgment: Judgment normal.      UC Treatments / Results  Labs (all labs ordered are listed, but only abnormal results are displayed) Labs Reviewed - No data to display  EKG   Radiology DG Hand Complete Right Result Date: 10/28/2023 EXAM: 3 OR MORE VIEW(S) XRAY OF THE RIGHT HAND 10/28/2023 10:39:40 AM COMPARISON: None available. CLINICAL HISTORY: fell over root in the woods injured right wrist FINDINGS: BONES AND JOINTS: No acute fracture. No focal osseous lesion. No joint dislocation. Diffuse interphalangeal joint space narrowing. SOFT TISSUES: The soft tissues are unremarkable. IMPRESSION: 1. No acute osseous abnormality. 2. Diffuse interphalangeal joint space narrowing, likely degenerative. Electronically signed by: Norleen Boxer MD 10/28/2023 11:01 AM EDT RP Workstation: [id number]    Procedures Procedures (including critical care time)  Medications Ordered in UC Medications - No data to display  Initial Impression / Assessment and Plan / UC Course  I have reviewed the triage vital signs and the nursing notes.  Pertinent labs & imaging results that were available during my care of the patient were reviewed by me and considered in my medical decision making (see chart for details).     X-ray of the  right hand negative for acute bony abnormality.  Discussed RICE, over-the-counter pain relievers, Ace wrap applied.  Return for worsening or unresolving symptoms.  Final Clinical Impressions(s) / UC Diagnoses   Final diagnoses:  Right hand pain  Fall, initial encounter     Discharge Instructions      Rest, ice, compression, elevation, over-the-counter pain relievers as needed.  Your x-ray was negative for any broken bones today which is great news.    ED Prescriptions   None    PDMP not reviewed this encounter.   Stuart Vernell Norris, [place] 10/28/23 1244

## 2023-11-08 DIAGNOSIS — R195 Other fecal abnormalities: Secondary | ICD-10-CM | POA: Diagnosis not present

## 2023-11-08 DIAGNOSIS — Z1211 Encounter for screening for malignant neoplasm of colon: Secondary | ICD-10-CM | POA: Diagnosis not present

## 2023-11-08 DIAGNOSIS — K635 Polyp of colon: Secondary | ICD-10-CM | POA: Diagnosis not present

## 2023-11-29 ENCOUNTER — Encounter: Attending: Internal Medicine | Admitting: Nutrition

## 2023-11-29 ENCOUNTER — Encounter: Payer: Self-pay | Admitting: Nutrition

## 2023-11-29 VITALS — Ht 68.0 in | Wt 161.0 lb

## 2023-11-29 DIAGNOSIS — R7303 Prediabetes: Secondary | ICD-10-CM | POA: Insufficient documentation

## 2023-11-29 DIAGNOSIS — E782 Mixed hyperlipidemia: Secondary | ICD-10-CM | POA: Insufficient documentation

## 2023-11-29 NOTE — Patient Instructions (Signed)
 Goals  Increase high fiber for 25 grams per day Keep walking Get LDL below 70 goal Get A1C down to 5.7% or below

## 2023-11-29 NOTE — Progress Notes (Signed)
 Medical Nutrition Therapy  Appointment Start time:  [phone] Appointment End time:  1030  Primary concerns today: Pre diabetes  Referral diagnosis: R73.03 Preferred learning style: No preference  Learning readiness: ready    NUTRITION ASSESSMENT  73 yr old wfemale referred for prediabetes. A1C 5.6%  PMH: Hyperlipidemia. [id number] mg/dl. Has a lot less stress now that her husband is in a cared facility. Has joined a hiking group, taking time to do more self care. Feels 100% better.Less stress. Eating Mediterrean Diet and more plant based. Back on a good sleep schedule. Has more energy. Goes back to Dr. Shona  Been walking 4 days per week.  Goals set previously Focus  on whole plant based foods-working on it [place] over [employer] and get cookbook from [employer] Living program- done Avoid cook out and other processed places-done Choose veggies when snacking done Find hobbies for night time Get/keep A1C below 5.7%  will check with MD in February. Get LDL Down to 100 or less-in progress Get in 25 grams of fiber per day working on it   [employer] Readings from Last 3 Encounters:  11/29/23 161 lb (73 kg)  09/21/22 160 lb (72.6 kg)  12/05/21 158 lb (71.7 kg)   Ht Readings from Last 3 Encounters:  11/29/23 5' 8 (1.727 m)  09/21/22 5' 8 (1.727 m)  12/05/21 5' 8 (1.727 m)   Body mass index is 24.48 kg/m. @BMIFA @ Facility age limit for growth %iles is 20 years. Facility age limit for growth %iles is 20 years.  Clinical Medical Hx:  Past Medical History:  Diagnosis Date   Anemia    Atrial septal aneurysm    Incidental finding, echo, 2008   Chest pain    Myoview  in the past, no definite ischemia,, possible inferior scar   //   cardiac catheterization July 19, 2007,  no significant coronary disease,  EF 55-60%   Ejection fraction    EF 50-55%, echo, February, 2008,  no Boesel motion abnormalities     EF 55-60%, catheterization, 2009   GERD (gastroesophageal reflux  disease)    Hypertension    Osteoporosis    Palpitations    Monitor in 2000 showed sinus rhythm and sinus tachycardia. There were occasional supraventricular beats. She was treated with beta blocker    Medications:  Current Outpatient Medications on File Prior to Visit  Medication Sig Dispense Refill   Biotin 5000 MCG TABS Take by mouth. Take 1 tablet by mouth daily     carvedilol  (COREG ) 6.25 MG tablet Take 1 tablet by mouth twice daily 180 tablet 1   Cholecalciferol (VITAMIN D-3) 5000 UNITS TABS Take 1 tablet by mouth daily.     co-enzyme Q-10 30 MG capsule Take 30 mg by mouth daily.     CYANOCOBALAMIN  IJ Inject 100 mg as directed once a week. Usually on Wednesday     cyclobenzaprine  (FLEXERIL ) 5 MG tablet Take 1 tablet (5 mg total) by mouth 3 (three) times daily as needed for muscle spasms. 20 tablet 0   fexofenadine (ALLEGRA) 180 MG tablet Take 180 mg by mouth daily.     meclizine (ANTIVERT) 25 MG tablet Take 25 mg by mouth 3 (three) times daily as needed for dizziness. Take 1 tablet every 6 hours as needed for dizziness.     Menaquinone-7 (VITAMIN K2 PO) Take by mouth.     Multiple Vitamin (MULTIVITAMIN WITH MINERALS) TABS Take 1 tablet by mouth daily.     naproxen  (NAPROSYN )  375 MG tablet Take 1 tablet (375 mg total) by mouth 2 (two) times daily. 20 tablet 0   [DISCONTINUED] cetirizine (ZYRTEC) 10 MG tablet Take 10 mg by mouth daily.     [DISCONTINUED] medroxyPROGESTERone (PROVERA) 2.5 MG tablet Take 2.5 mg by mouth daily.     [DISCONTINUED] omeprazole (PRILOSEC) 20 MG capsule Take 20 mg by mouth daily.     [DISCONTINUED] thyroid  (ARMOUR) 30 MG tablet Take 30 mg by mouth daily.     No current facility-administered medications on file prior to visit.   Labs: Pre Dm A1C 5,6% now, had been up to 5.9%. LDL high at 158 mg/dl.  Notable Signs/Symptoms: none  Lifestyle & Dietary Hx Married. Spouse is in a nursing care facility Eats 3 meals per day [facility] for exercise.  Estimated  daily fluid intake: 80 oz Supplements: B12, D3, Omega 3, Biotin, Coq10, Vit K2 MK, Viactiv Calcium, Mg, Sleep: good Stress / self-care: yes Current average weekly physical activity: walks most days  24-Hr Dietary Recall First Meal: 1 slice ww toast, vegetables, feta cheese, or egg sandwich or oatmeal with nuts, fruit  Second Meal:  chicken breast with bacon, corn, fruit, water Dinner: [employer], Snack:  Beverages: water  Estimated Energy Needs Calories: 1500 Carbohydrate: 170g Protein: 112g Fat: 42g   NUTRITION DIAGNOSIS  NB-1.1 Food and nutrition-related knowledge deficit As related to Inconsistent or excessive CHO and fat diet.  As evidenced by A1C 5.9% and LDL 158.   NUTRITION INTERVENTION  Nutrition education (E-1) on the following topics:  Nutrition and Diabetes education provided on My Plate, CHO counting, meal planning, portion sizes, timing of meals, avoiding snacks between meals unless having a low blood sugar, target ranges for A1C and blood sugars, signs/symptoms and treatment of hyper/hypoglycemia, monitoring blood sugars, taking medications as prescribed, benefits of exercising 30 minutes per day and prevention of complications of DM.  Lifestyle Medicine  - Whole Food, Plant Predominant Nutrition is highly recommended: Eat Plenty of vegetables, Mushrooms, fruits, Legumes, Whole Grains, Nuts, seeds in lieu of processed meats, processed snacks/pastries red meat, poultry, eggs.    -It is better to avoid simple carbohydrates including: Cakes, Sweet Desserts, Ice Cream, Soda (diet and regular), Sweet Tea, Candies, Chips, Cookies, Store Bought Juices, Alcohol in Excess of  1-2 drinks a day, Lemonade,  Artificial Sweeteners, Doughnuts, Coffee Creamers, Sugar-free Products, etc, etc.  This is not a complete list.....  Exercise: If you are able: 30 -60 minutes a day ,4 days a week, or 150 minutes a week.  The longer the better.  Combine stretch, strength, and aerobic  activities.  If you were told in the past that you have high risk for cardiovascular diseases, you may seek evaluation by your heart doctor prior to initiating moderate to intense exercise programs.   Handouts Provided Include  LIfestyle Medicine  Learning Style & Readiness for Change Teaching method utilized: Visual & Auditory  Demonstrated degree of understanding via: Teach Back  Barriers to learning/adherence to lifestyle change: none  Goals Established by Pt Goals  Increase high fiber for 25 grams per day Keep walking Get LDL below 70 goal Get A1C down to 5.7% or below   MONITORING & EVALUATION Dietary intake, weekly physical activity, and labs in 3 months.  Next Steps  Patient is to work on following a more whole plant based lifestyle.SABRA

## 2024-01-18 ENCOUNTER — Encounter: Payer: Self-pay | Admitting: *Deleted

## 2024-01-18 NOTE — Progress Notes (Signed)
 [facility]                                          MRN: [id number]   01/18/2024   The [facility] Quality Team Specialist reviewed this patient medical record for the purposes of chart review for care gap closure. The following were reviewed: chart review for care gap closure-controlling blood pressure.    [facility] Quality Team

## 2024-03-13 ENCOUNTER — Encounter: Admitting: Nutrition

## 2024-03-26 IMAGING — CT NM PET TUM IMG INITIAL (PI) SKULL BASE T - THIGH
7 series · 25 of 25 positions shown · non-contrast
Comparison: Calcium score CT chest dated 04/04/2021. CT chest dated
06/08/2014.

CLINICAL DATA: Initial treatment strategy for lingular nodule.

EXAM:
NUCLEAR MEDICINE PET SKULL BASE TO THIGH
TECHNIQUE: 9.25 mCi F-18 FDG was injected intravenously. Full-ring PET imaging
was performed from the skull base to thigh after the radiotracer. CT
data was obtained and used for attenuation correction and anatomic
localization.
Fasting blood glucose: 114 mg/dl

[Series 3: ct slices · axial · 4.0mm · 0.98mm/px · z∈[-904,-28]mm · 5 of 220 slices shown]
[im 1/220]
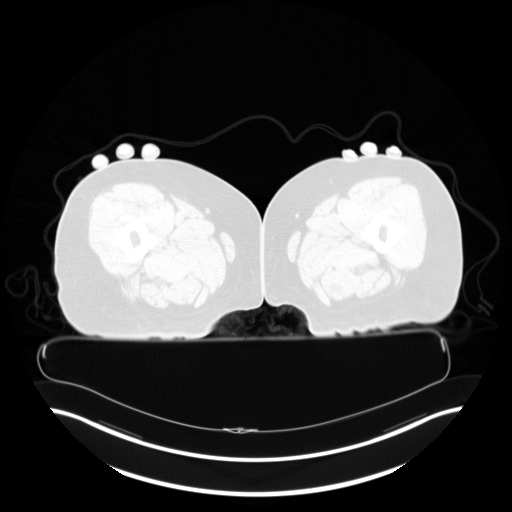
[im 55/220]
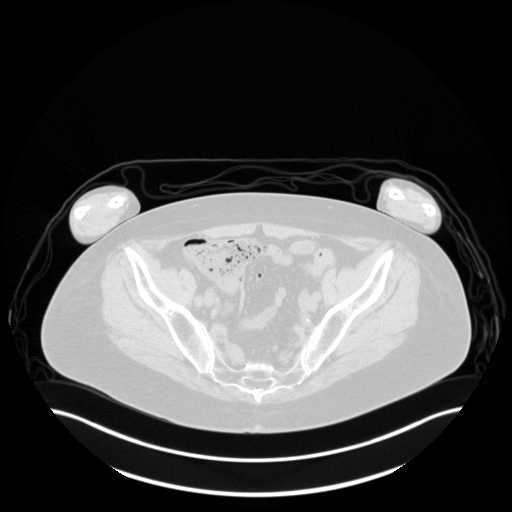
[im 110/220]
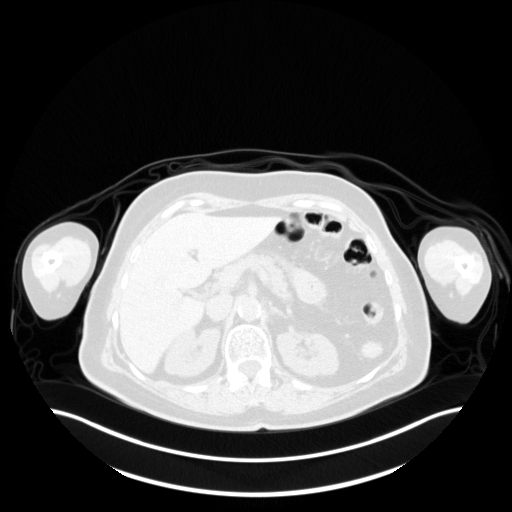
[im 165/220]
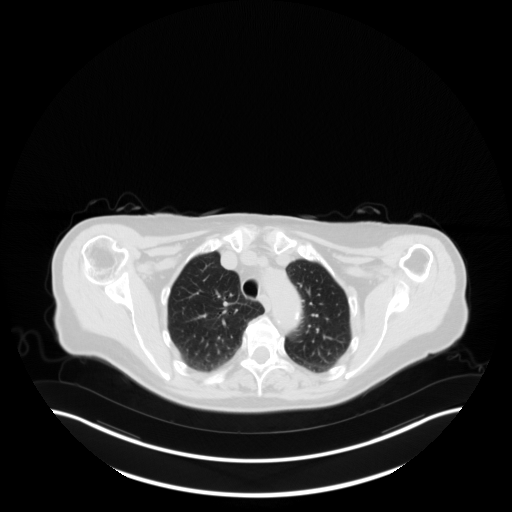
[im 220/220  brain]
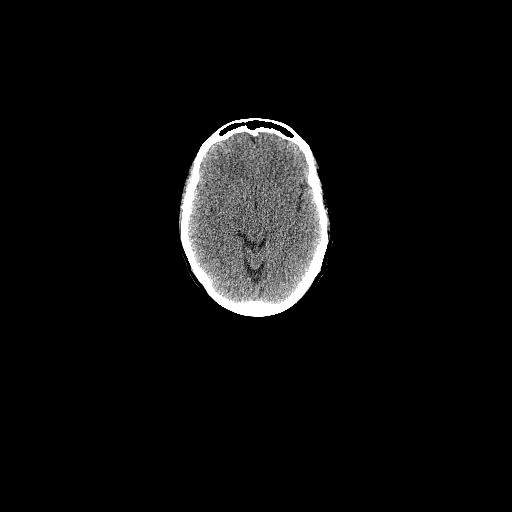

[Series 4: pet ac · axial · 4.0mm · 4.11mm/px · z∈[-904,-28]mm · 4 of 220 slices shown]
[im 1/220]
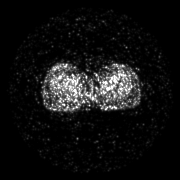
[im 74/220]
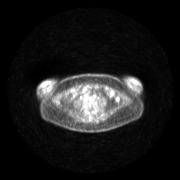
[im 147/220]
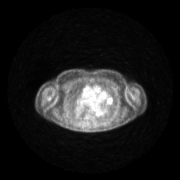
[im 220/220]
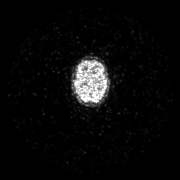

[Series 5: pet nac · axial · 4.0mm · 4.11mm/px · z∈[-904,-28]mm · 4 of 220 slices shown]
[im 1/220]
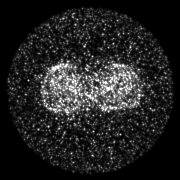
[im 74/220]
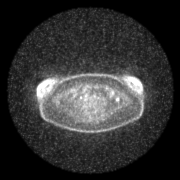
[im 147/220]
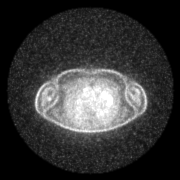
[im 220/220]
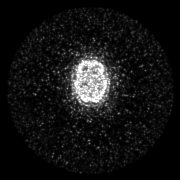

[Series 7: ct lung · axial · 4.0mm · 0.98mm/px · 1 of 63 slices shown]
[im 1/63]
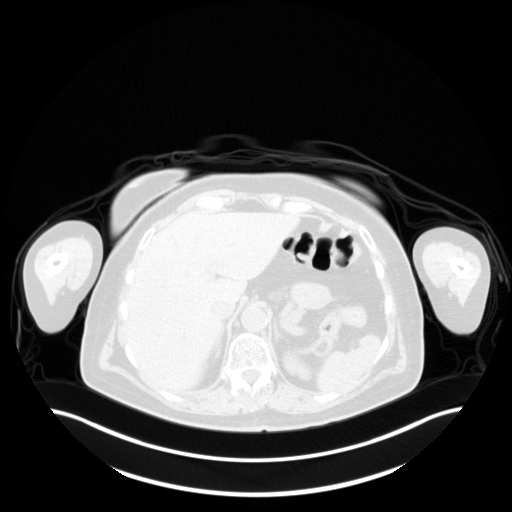

[Series 606: fused tra · 8 of 438 slices shown]
[im 1/438]
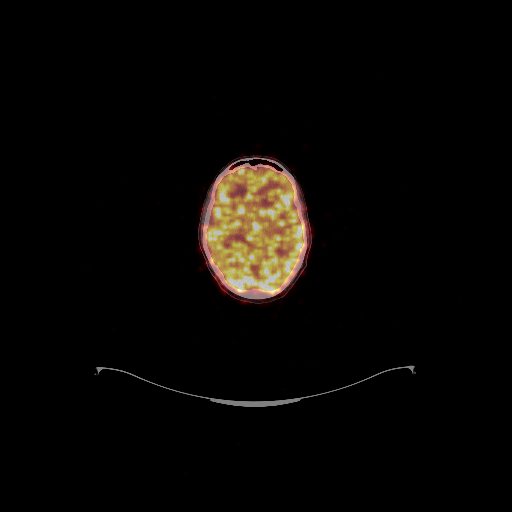
[im 63/438]
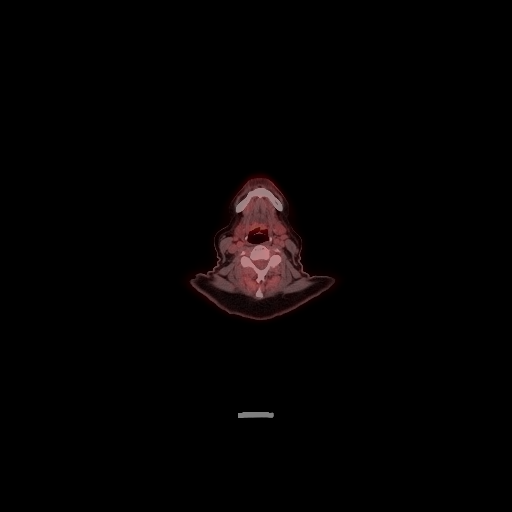
[im 125/438]
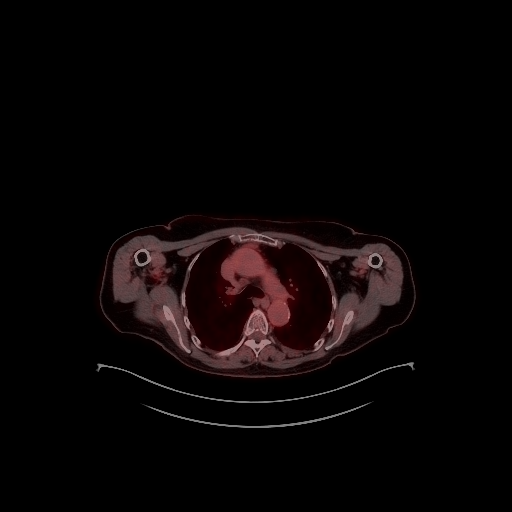
[im 188/438]
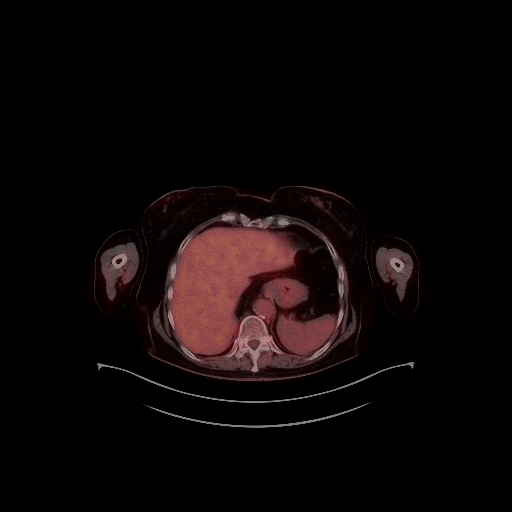
[im 250/438]
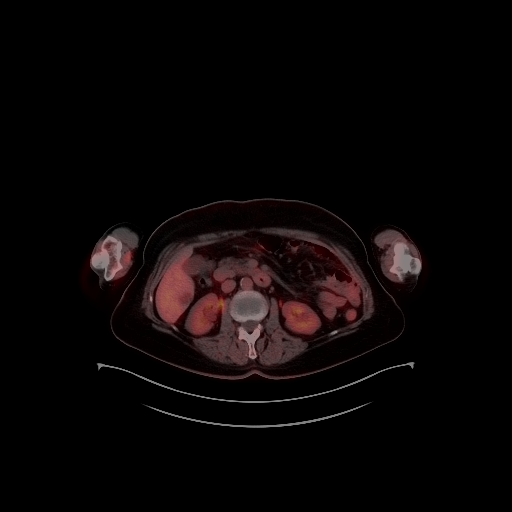
[im 313/438]
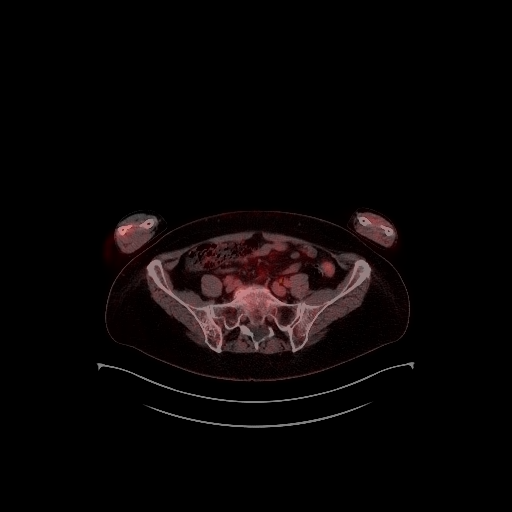
[im 375/438]
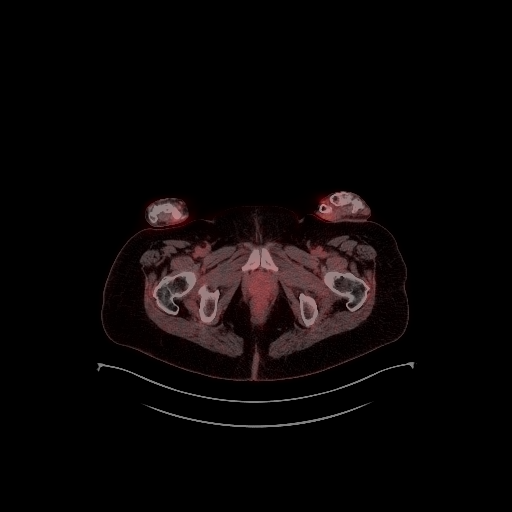
[im 438/438]
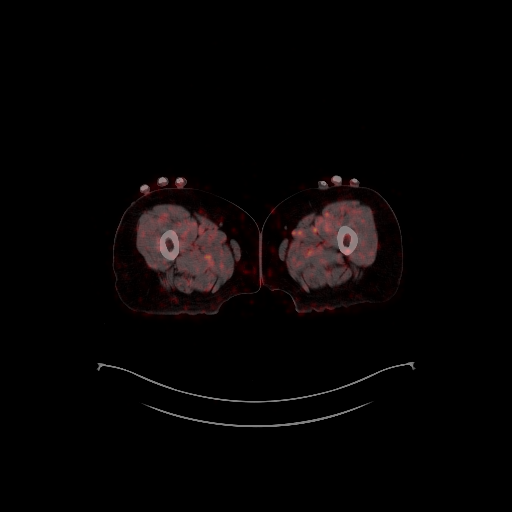

[Series 608: fused cor · 2 of 134 slices shown]
[im 1/134]
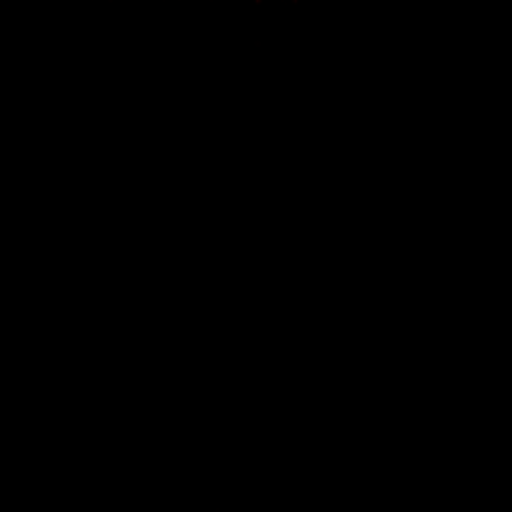
[im 134/134]
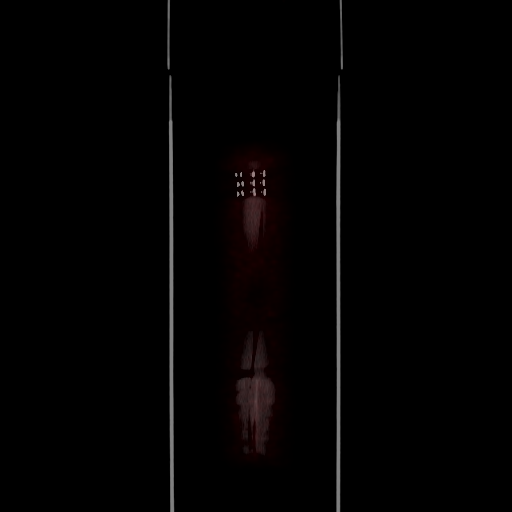

[Series 609: mip cine · coronal · 1.82mm/px · 1 of 48 slices shown]
[im 1/48]
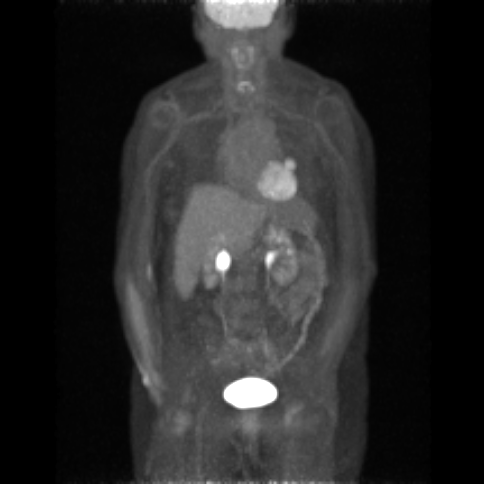

[25 of 25 positions shown; findings below may reference images not displayed]

FINDINGS: Mediastinal blood pool activity: SUV max

Liver activity: SUV max NA

NECK: No hypermetabolic cervical lymphadenopathy.

Incidental CT findings: none

CHEST: [DATE] x 2.4 cm lingular nodule (series 7/image 37), previously
14 x 10 mm in 1151, max SUV 6.4.

No hypermetabolic thoracic lymphadenopathy.

Incidental CT findings: Mild atherosclerotic calcifications of the
arch. Mild coronary atherosclerosis of the LAD. Moderate herniation
of fat via the esophageal hiatus, progressive.

ABDOMEN/PELVIS: No abnormal hypermetabolism in the liver, spleen,
pancreas, or adrenal glands.

No hypermetabolic abdominopelvic lymphadenopathy.

Incidental CT findings: Cholelithiasis, without associated
inflammatory changes. Bilateral renal sinus cysts. Benign hepatic
cysts. Left colonic diverticulosis, without evidence of
diverticulitis. Atherosclerotic calcifications the abdominal aorta
and branch vessels.

SKELETON: No focal hypermetabolic activity to suggest skeletal
metastasis.

Incidental CT findings: Degenerative changes of the lumbar spine.
IMPRESSION: Hypermetabolic lingular nodule, progressive from 1151, compatible
with primary bronchogenic neoplasm.

No findings suspicious for metastatic disease.
